# Patient Record
Sex: Female | Born: 1995 | Hispanic: Yes | Marital: Single | Smoking: Never smoker
Health system: Southern US, Community
[De-identification: ages and names within clinical notes are randomized; demographics above are authoritative.]

## PROBLEM LIST (undated history)

## (undated) ENCOUNTER — Inpatient Hospital Stay (HOSPITAL_COMMUNITY): Payer: Self-pay

## (undated) DIAGNOSIS — N200 Calculus of kidney: Secondary | ICD-10-CM

## (undated) DIAGNOSIS — F32A Depression, unspecified: Secondary | ICD-10-CM

## (undated) DIAGNOSIS — F329 Major depressive disorder, single episode, unspecified: Secondary | ICD-10-CM

## (undated) HISTORY — PX: NO PAST SURGERIES: SHX2092

## (undated) HISTORY — PX: OTHER SURGICAL HISTORY: SHX169

---
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
## 2015-12-15 ENCOUNTER — Encounter (HOSPITAL_COMMUNITY): Payer: Self-pay | Admitting: Emergency Medicine

## 2015-12-15 ENCOUNTER — Emergency Department (HOSPITAL_COMMUNITY)
Admission: EM | Admit: 2015-12-15 | Discharge: 2015-12-15 | Disposition: A | Discharge status: Home / Self Care | Payer: Medicaid Other | Attending: Emergency Medicine | Admitting: Emergency Medicine

## 2015-12-15 DIAGNOSIS — L731 Pseudofolliculitis barbae: Secondary | ICD-10-CM | POA: Insufficient documentation

## 2015-12-15 DIAGNOSIS — Z791 Long term (current) use of non-steroidal anti-inflammatories (NSAID): Secondary | ICD-10-CM | POA: Diagnosis not present

## 2015-12-15 DIAGNOSIS — Z792 Long term (current) use of antibiotics: Secondary | ICD-10-CM | POA: Insufficient documentation

## 2015-12-15 DIAGNOSIS — R21 Rash and other nonspecific skin eruption: Secondary | ICD-10-CM | POA: Diagnosis present

## 2015-12-15 NOTE — Discharge Instructions (Signed)
Ingrown Hair An ingrown hair is a hair that curls and re-enters the skin instead of growing straight out of the skin. It happens most often with curly hair. It is usually more severe in the neck area, but it can occur in any shaved area, including the beard area, groin, scalp, and legs. An ingrown hair may cause small pockets of infection. CAUSES  Shaving closely, tweezing, or waxing, especially curly hair. Using hair removal creams can sometimes lead to ingrown hairs, especially in the groin. SYMPTOMS   Small bumps on the skin. The bumps may be filled with pus.  Pain.  Itching. DIAGNOSIS  Your caregiver can usually tell what is wrong by doing a physical exam. TREATMENT  If there is a severe infection, your caregiver may prescribe antibiotic medicines. Laser hair removal may also be done to help prevent regrowth of the hair. HOME CARE INSTRUCTIONS   Do not shave irritated skin. You may start shaving again once the irritation has gone away.  If you are prone to ingrown hairs, consider not shaving as much as possible.  If antibiotics are prescribed, take them as directed. Finish them even if you start to feel better.  You may use a facial sponge in a gentle circular motion to help dislodge ingrown hairs on the face.  You may use a hair removal cream weekly, especially on the legs and underarms. Stop using the cream if it irritates your skin. Use caution when using hair removal creams in the groin area. SHAVING INSTRUCTIONS AFTER TREATMENT  Shower before shaving. Keep areas to be shaved packed in warm, moist wraps for several minutes before shaving. The warm, moist environment helps soften the hairs and makes ingrown hairs less likely to occur.  Use thick shaving gels.  Use a bump fighter razor that cuts hair slightly above the skin level or use an electric shaver with a longer shave setting.  Shave in the direction of hair growth. Avoid making multiple razor strokes.  Use  moisturizing lotions after shaving.   This information is not intended to replace advice given to you by your health care provider. Make sure you discuss any questions you have with your health care provider.   Document Released: 08/19/2000 Document Revised: 11/12/2011 Document Reviewed: 08/13/2011 Elsevier Interactive Patient Education 2016 [employer]  

## 2015-12-15 NOTE — ED Provider Notes (Signed)
CSN: [id number]     Arrival date & time 12/15/15  0025 History  By signing my name below, I, Emmanuella Mensah, attest that this documentation has been prepared under the direction and in the presence of Melene Plan, DO. Electronically Signed: Angelene Giovanni, ED Scribe. 12/15/2015. 1:21 AM.    Chief Complaint  Patient presents with  . Rash   Patient is a 20 y.o. female presenting with rash. The history is provided by the patient. No language interpreter was used.  Rash Location:  Ano-genital Ano-genital rash location:  Vagina Quality: blistering and itchiness   Quality: not painful   Severity:  Moderate Onset quality:  Sudden Duration:  1 week Timing:  Constant Chronicity:  New Context: sick contacts   Relieved by:  Anti-itch cream Worsened by:  Nothing tried Ineffective treatments:  None tried Associated symptoms: no abdominal pain, no fever, no headaches, no joint pain, no myalgias, no nausea, no shortness of breath, not vomiting and not wheezing    HPI Comments: Zoe-Rosenda Lawhorne is a 20 y.o. female who presents to the Emergency Department requesting evaluation for an itchy, blistering "spot" on her vaginal area that occurred one week ago. Pt denies any pain to the area. She explains that her boyfriend was diagnosed with chicken pox last week with a similar rash to his arms and legs. No alleviating factors noted. She states that she has tried Cortisone cream with no relief. No fever, chills, abdominal pain, or n/v.    History reviewed. No pertinent past medical history. History reviewed. No pertinent past surgical history. History reviewed. No pertinent family history. Social History  Substance Use Topics  . Smoking status: Never Smoker   . Smokeless tobacco: None  . Alcohol Use: No   OB History    No data available     Review of Systems  Constitutional: Negative for fever and chills.  HENT: Negative for congestion and rhinorrhea.   Eyes: Negative for redness and visual  disturbance.  Respiratory: Negative for shortness of breath and wheezing.   Cardiovascular: Negative for chest pain and palpitations.  Gastrointestinal: Negative for nausea, vomiting and abdominal pain.  Genitourinary: Negative for dysuria and urgency.  Musculoskeletal: Negative for myalgias and arthralgias.  Skin: Positive for rash. Negative for pallor and wound.  Neurological: Negative for dizziness and headaches.  All other systems reviewed and are negative.     Allergies  Mango flavor  Home Medications   Prior to Admission medications   Medication Sig Start Date End Date Taking? Authorizing Provider  amoxicillin (AMOXIL) 500 MG capsule Take 1 capsule (500 mg total) by mouth 2 (two) times daily. 12/27/14   Charm Rings, MD  naproxen (NAPROSYN) 500 MG tablet Take 1 tablet (500 mg total) by mouth 2 (two) times daily with a meal. 03/27/14   Everlene Farrier, PA-C  trimethoprim-polymyxin b (POLYTRIM) ophthalmic solution Place 1 drop into both eyes every 4 (four) hours. For 7 days 12/27/14   Charm Rings, MD   BP 103/80 mmHg  Pulse 67  Temp(Src) 98.2 F (36.8 C) (Oral)  Resp 18  SpO2 100%  LMP 11/21/2015 (Approximate) Physical Exam  Constitutional: She is oriented to person, place, and time. She appears well-developed and well-nourished. No distress.  HENT:  Head: Normocephalic and atraumatic.  Eyes: EOM are normal.  Neck: Normal range of motion.  Cardiovascular: Normal rate, regular rhythm and normal heart sounds.   Pulmonary/Chest: Effort normal and breath sounds normal.  Abdominal: Soft. She exhibits no distension. There  is no tenderness.  Musculoskeletal: Normal range of motion.  Neurological: She is alert and oriented to person, place, and time.  Skin: Skin is warm and dry. There is erythema.  Small erythematous papule in pubic mons, Non TTP, no fluctuance, similar lesion laterally that is mildly tender  Psychiatric: She has a normal mood and affect. Judgment normal.  Nursing  note and vitals reviewed.   ED Course  Procedures (including critical care time) DIAGNOSTIC STUDIES: Oxygen Saturation is 99% on RA, normal by my interpretation.    COORDINATION OF CARE: 1:10 AM- Pt advised of plan for treatment and pt agrees. Pt advised to use warm compresses.   MDM   Final diagnoses:  Ingrown hair    20 yo F With a chief complaint of a itchy bump to her pubis. She is concerned because her boyfriend was recently diagnosed with chickenpox. She is unsure if she is immunized though is in college. On my exam patient appears to have an ingrown hair. Discussed with patient about follow-up. She is advised to avoid immunosuppressed individuals as well as pregnant individuals until she was sure the rash did not spread. I personally performed the services described in this documentation, which was scribed in my presence. The recorded information has been reviewed and is accurate.   2:26 AM:  I have discussed the diagnosis/risks/treatment options with the patient and family and believe the pt to be eligible for discharge home to follow-up with PCP. We also discussed returning to the ED immediately if new or worsening sx occur. We discussed the sx which are most concerning (e.g., sudden worsening pain, fever, inability to tolerate by mouth) that necessitate immediate return. Medications administered to the patient during their visit and any new prescriptions provided to the patient are listed below.  Medications given during this visit Medications - No data to display  Discharge Medication List as of 12/15/2015  1:19 AM      The patient appears reasonably screen and/or stabilized for discharge and I doubt any other medical condition or other [facility] requiring further screening, evaluation, or treatment in the ED at this time prior to discharge.      Melene Planan Tanvi Gatling, DO 12/15/15 [id number]

## 2015-12-15 NOTE — ED Notes (Signed)
Pt states that her boyfriend had 'spots' on his arm and leg and was reportedly dx with chicken pox. Pt now has a 'spot' on her vaginal area. Alert and oriented.

## 2015-12-24 ENCOUNTER — Encounter (HOSPITAL_COMMUNITY): Payer: Self-pay | Admitting: Emergency Medicine

## 2015-12-24 ENCOUNTER — Emergency Department (HOSPITAL_COMMUNITY)
Admission: EM | Admit: 2015-12-24 | Discharge: 2015-12-24 | Disposition: A | Discharge status: Home / Self Care | Payer: Medicaid Other | Attending: Emergency Medicine | Admitting: Emergency Medicine

## 2015-12-24 DIAGNOSIS — L739 Follicular disorder, unspecified: Secondary | ICD-10-CM

## 2015-12-24 DIAGNOSIS — Z202 Contact with and (suspected) exposure to infections with a predominantly sexual mode of transmission: Secondary | ICD-10-CM | POA: Insufficient documentation

## 2015-12-24 DIAGNOSIS — N764 Abscess of vulva: Secondary | ICD-10-CM | POA: Diagnosis present

## 2015-12-24 LAB — RAPID HIV SCREEN (HIV 1/2 AB+AG)
HIV 1/2 ANTIBODIES: NONREACTIVE
HIV-1 P24 ANTIGEN - HIV24: NONREACTIVE

## 2015-12-24 LAB — URINALYSIS, ROUTINE W REFLEX MICROSCOPIC
Bilirubin Urine: NEGATIVE
GLUCOSE, UA: NEGATIVE mg/dL
Hgb urine dipstick: NEGATIVE
KETONES UR: NEGATIVE mg/dL
LEUKOCYTES UA: NEGATIVE
NITRITE: NEGATIVE
PH: 8 (ref 5.0–8.0)
PROTEIN: NEGATIVE mg/dL
Specific Gravity, Urine: 1.022 (ref 1.005–1.030)

## 2015-12-24 LAB — WET PREP, GENITAL
Clue Cells Wet Prep HPF POC: NONE SEEN
SPERM: NONE SEEN
Trich, Wet Prep: NONE SEEN
YEAST WET PREP: NONE SEEN

## 2015-12-24 LAB — RPR: RPR Ser Ql: NONREACTIVE

## 2015-12-24 LAB — URINE MICROSCOPIC-ADD ON

## 2015-12-24 LAB — POC URINE PREG, ED: Preg Test, Ur: NEGATIVE

## 2015-12-24 MED ORDER — CEFTRIAXONE SODIUM 250 MG IJ SOLR
250.0000 mg | Freq: Once | INTRAMUSCULAR | Status: AC
  Administered 2015-12-24: 250 mg via INTRAMUSCULAR
  Filled 2015-12-24: qty 250

## 2015-12-24 MED ORDER — STERILE WATER FOR INJECTION IJ SOLN
INTRAMUSCULAR | Status: AC
  Administered 2015-12-24: 10 mL
  Filled 2015-12-24: qty 10

## 2015-12-24 MED ORDER — MUPIROCIN CALCIUM 2 % EX CREA: 1.0000 "application " | TOPICAL_CREAM | Freq: Two times a day (BID) | CUTANEOUS | 0 refills | Status: DC

## 2015-12-24 MED ORDER — AZITHROMYCIN 250 MG PO TABS
1000.0000 mg | ORAL_TABLET | Freq: Once | ORAL | Status: AC
  Administered 2015-12-24: 1000 mg via ORAL
  Filled 2015-12-24: qty 4

## 2015-12-24 NOTE — Discharge Instructions (Signed)
You were seen and evaluated today for your rash.  This appears to be some sort of infection.  It could be a Herpes infection and cultures were sent to evaluate for this.  If the culture is positive you will be called.  It also appears that it could just be a folliculitis or infection of the skin around hair follicles.  Use the antibiotic cream prescribed.

## 2015-12-24 NOTE — ED Triage Notes (Signed)
Pt. reports skin abscess at pubic area with drainage , requesting STD screening , denies dysuria or vaginal discharge .

## 2015-12-24 NOTE — ED Notes (Signed)
Pt is in stable condition upon d/c and ambulates from ED. 

## 2015-12-24 NOTE — ED Provider Notes (Signed)
[facility]-EMERGENCY DEPT Provider Note   CSN: [id number] Arrival date & time: 12/24/15  0310  First Provider Contact:  First MD Initiated Contact with Patient 12/24/15 [phone]        History   Chief Complaint Chief Complaint  Patient presents with  . Abscess    HPI Adrienne Craig is a 20 y.o. female.  20 y.o. Female with no significant past medical history presents for concern for possible abscess.  The patient states that for over 1 week she has had a skin lesion in the genital area.  She states that she was seen for this same issue 1 week ago and was told that it was an ingrown hair.  The patient states that her boyfriend had a similar rash and was told he had a virus of some sort and was given a shot in the butt.  She states that since that time the two of them have had sexual intercourse and she is concerned and would like to be tested and treated for STDs.       History reviewed. No pertinent past medical history.  There are no active problems to display for this patient.   History reviewed. No pertinent surgical history.  OB History    No data available       Home Medications    Prior to Admission medications   Medication Sig Start Date End Date Taking? Authorizing Provider  amoxicillin (AMOXIL) 500 MG capsule Take 1 capsule (500 mg total) by mouth 2 (two) times daily. Patient not taking: Reported on 12/24/2015 12/27/14   Charm Rings, MD  mupirocin cream (BACTROBAN) 2 % Apply 1 application topically 2 (two) times daily. 12/24/15   Leta Baptist, MD  naproxen (NAPROSYN) 500 MG tablet Take 1 tablet (500 mg total) by mouth 2 (two) times daily with a meal. Patient not taking: Reported on 12/24/2015 03/27/14   Everlene Farrier, PA-C  trimethoprim-polymyxin b (POLYTRIM) ophthalmic solution Place 1 drop into both eyes every 4 (four) hours. For 7 days Patient not taking: Reported on 12/24/2015 12/27/14   Charm Rings, MD    Family History No family history on file.  Social  History Social History  Substance Use Topics  . Smoking status: Never Smoker  . Smokeless tobacco: Not on file  . Alcohol use No     Allergies   Mango flavor [flavoring agent]   Review of Systems Review of Systems  Constitutional: Negative for appetite change, diaphoresis, fatigue and fever.  HENT: Negative for congestion, postnasal drip and rhinorrhea.   Eyes: Negative for visual disturbance.  Respiratory: Negative for cough, chest tightness, shortness of breath and wheezing.   Cardiovascular: Negative for chest pain and palpitations.  Gastrointestinal: Negative for abdominal pain, diarrhea, nausea and vomiting.  Genitourinary: Negative for dysuria, frequency and urgency.  Musculoskeletal: Negative for back pain and myalgias.  Skin: Positive for rash.  Neurological: Negative for dizziness, weakness, light-headedness and headaches.  Hematological: Does not bruise/bleed easily.     Physical Exam Updated Vital Signs BP 100/55   Pulse (!) 52   LMP 12/12/2015 (Approximate)   SpO2 99%   Physical Exam  Constitutional: She is oriented to person, place, and time. She appears well-developed and well-nourished. No distress.  HENT:  Head: Normocephalic and atraumatic.  Right Ear: External ear normal.  Left Ear: External ear normal.  Nose: Nose normal.  Mouth/Throat: Oropharynx is clear and moist. No oropharyngeal exudate.  Eyes: EOM are normal. Pupils are equal, round, and reactive  to light.  Neck: Normal range of motion. Neck supple.  Cardiovascular: Normal rate, regular rhythm, normal heart sounds and intact distal pulses.   No murmur heard. Pulmonary/Chest: Effort normal. No respiratory distress. She has no wheezes. She has no rales.  Abdominal: Soft. She exhibits no distension. There is no tenderness.  Genitourinary: Uterus normal. No labial fusion. There is no rash on the right labia. There is no rash on the left labia. Cervix exhibits discharge and friability. Cervix  exhibits no motion tenderness. Right adnexum displays no mass, no tenderness and no fullness. Left adnexum displays no mass, no tenderness and no fullness. No erythema or bleeding in the vagina. Vaginal discharge found.  Genitourinary Comments: Over the pubis area there are multiple erythematous, dried lesions as well as one area with two vesicular type lesions.  No abscess.  No labial involvement  Musculoskeletal: Normal range of motion. She exhibits no edema or tenderness.  Neurological: She is alert and oriented to person, place, and time.  Skin: Skin is warm and dry. No rash noted. She is not diaphoretic.  Vitals reviewed.    ED Treatments / Results  Labs (all labs ordered are listed, but only abnormal results are displayed) Labs Reviewed  WET PREP, GENITAL - Abnormal; Notable for the following:       Result Value   WBC, Wet Prep HPF POC MANY (*)    All other components within normal limits  URINALYSIS, ROUTINE W REFLEX MICROSCOPIC (NOT AT [facility]) - Abnormal; Notable for the following:    APPearance TURBID (*)    All other components within normal limits  URINE MICROSCOPIC-ADD ON - Abnormal; Notable for the following:    Squamous Epithelial / LPF 0-5 (*)    Bacteria, UA FEW (*)    All other components within normal limits  HSV CULTURE AND TYPING  RAPID HIV SCREEN (HIV 1/2 AB+AG)  RPR  POC URINE PREG, ED  GC/CHLAMYDIA PROBE AMP ([place]) NOT AT [facility]    EKG  EKG Interpretation None       Radiology No results found.  Procedures Procedures (including critical care time)  Medications Ordered in ED Medications  cefTRIAXone (ROCEPHIN) injection 250 mg (250 mg Intramuscular Given 12/24/15 0957)  azithromycin (ZITHROMAX) tablet 1,000 mg (1,000 mg Oral Given 12/24/15 0957)  sterile water (preservative free) injection (10 mLs  Given 12/24/15 0957)     Initial Impression / Assessment and Plan / ED Course  I have reviewed the triage vital signs and the nursing  notes.  Pertinent labs & imaging results that were available during my care of the patient were reviewed by me and considered in my medical decision making (see chart for details).  Clinical Course    Patient seen and evaluated in stable condition.  Rash consistent with folliculitis vs HSV.  Discussed with patient who did not want treatment for HSV at this time and wanted to wait for culture results.  STI cultures sent.  Patient treated with Rocephin and Azithromycin.  Patient discharged home in stable condition with prescription for Mupirocin.    Final Clinical Impressions(s) / ED Diagnoses   Final diagnoses:  Folliculitis  Possible exposure to STD    New Prescriptions Discharge Medication List as of 12/24/2015 10:01 AM    START taking these medications   Details  mupirocin cream (BACTROBAN) 2 % Apply 1 application topically 2 (two) times daily., Starting Sun 12/24/2015, Print         Leta Baptist, MD 12/24/15 1556

## 2015-12-25 LAB — GC/CHLAMYDIA PROBE AMP (~~LOC~~) NOT AT ARMC
Chlamydia: NEGATIVE
Neisseria Gonorrhea: NEGATIVE

## 2015-12-26 LAB — HSV CULTURE AND TYPING

## 2016-05-13 ENCOUNTER — Emergency Department (HOSPITAL_COMMUNITY)
Admission: EM | Admit: 2016-05-13 | Discharge: 2016-05-13 | Disposition: A | Discharge status: Home / Self Care | Payer: Medicaid Other | Attending: Emergency Medicine | Admitting: Emergency Medicine

## 2016-05-13 ENCOUNTER — Encounter (HOSPITAL_COMMUNITY): Payer: Self-pay | Admitting: Emergency Medicine

## 2016-05-13 ENCOUNTER — Ambulatory Visit (INDEPENDENT_AMBULATORY_CARE_PROVIDER_SITE_OTHER): Payer: Self-pay | Admitting: Urgent Care

## 2016-05-13 ENCOUNTER — Emergency Department (HOSPITAL_COMMUNITY): Payer: Medicaid Other

## 2016-05-13 VITALS — BP 134/76 | HR 86 | Temp 97.7°F | Resp 20

## 2016-05-13 DIAGNOSIS — N132 Hydronephrosis with renal and ureteral calculous obstruction: Secondary | ICD-10-CM | POA: Diagnosis not present

## 2016-05-13 DIAGNOSIS — R112 Nausea with vomiting, unspecified: Secondary | ICD-10-CM

## 2016-05-13 DIAGNOSIS — R319 Hematuria, unspecified: Secondary | ICD-10-CM

## 2016-05-13 DIAGNOSIS — R1031 Right lower quadrant pain: Secondary | ICD-10-CM

## 2016-05-13 DIAGNOSIS — R100 Acute abdomen: Secondary | ICD-10-CM

## 2016-05-13 DIAGNOSIS — N23 Unspecified renal colic: Secondary | ICD-10-CM

## 2016-05-13 DIAGNOSIS — Z79899 Other long term (current) drug therapy: Secondary | ICD-10-CM | POA: Insufficient documentation

## 2016-05-13 DIAGNOSIS — R109 Unspecified abdominal pain: Secondary | ICD-10-CM | POA: Diagnosis present

## 2016-05-13 LAB — POC MICROSCOPIC URINALYSIS (UMFC): Mucus: ABSENT

## 2016-05-13 LAB — COMPREHENSIVE METABOLIC PANEL
ALK PHOS: 74 U/L (ref 38–126)
ALT: 61 U/L — ABNORMAL HIGH (ref 14–54)
AST: 50 U/L — AB (ref 15–41)
Albumin: 4.6 g/dL (ref 3.5–5.0)
Anion gap: 12 (ref 5–15)
BILIRUBIN TOTAL: 1.5 mg/dL — AB (ref 0.3–1.2)
BUN: 14 mg/dL (ref 6–20)
CALCIUM: 9.2 mg/dL (ref 8.9–10.3)
CO2: 20 mmol/L — ABNORMAL LOW (ref 22–32)
CREATININE: 0.86 mg/dL (ref 0.44–1.00)
Chloride: 106 mmol/L (ref 101–111)
GFR calc Af Amer: >60 mL/min (ref >60)
Glucose, Bld: 106 mg/dL — ABNORMAL HIGH (ref 65–99)
Potassium: 3.6 mmol/L (ref 3.5–5.1)
Sodium: 138 mmol/L (ref 135–145)
TOTAL PROTEIN: 8.3 g/dL — AB (ref 6.5–8.1)

## 2016-05-13 LAB — POCT URINALYSIS DIP (MANUAL ENTRY)
BILIRUBIN UA: NEGATIVE
BILIRUBIN UA: NEGATIVE
GLUCOSE UA: NEGATIVE
Nitrite, UA: NEGATIVE
Urobilinogen, UA: 0.2
pH, UA: 6.5

## 2016-05-13 LAB — POCT CBC
Granulocyte percent: 76.3 %G (ref 37–80)
HCT, POC: 39.7 % (ref 37.7–47.9)
HEMOGLOBIN: 14.4 g/dL (ref 12.2–16.2)
Lymph, poc: 2.2 (ref 0.6–3.4)
MCH: 32.2 pg — AB (ref 27–31.2)
MCHC: 36.3 g/dL — AB (ref 31.8–35.4)
MCV: 88.7 fL (ref 80–97)
MID (CBC): 0.4 (ref 0–0.9)
MPV: 6.2 fL (ref 0–99.8)
PLATELET COUNT, POC: 265 10*3/uL (ref 142–424)
POC Granulocyte: 8.5 — AB (ref 2–6.9)
POC LYMPH PERCENT: 19.9 %L (ref 10–50)
POC MID %: 3.8 %M (ref 0–12)
RBC: 4.47 M/uL (ref 4.04–5.48)
RDW, POC: 12.2 %
WBC: 11.2 10*3/uL — AB (ref 4.6–10.2)

## 2016-05-13 LAB — URINALYSIS, ROUTINE W REFLEX MICROSCOPIC
Bilirubin Urine: NEGATIVE
GLUCOSE, UA: NEGATIVE mg/dL
Ketones, ur: 20 mg/dL — AB
Nitrite: NEGATIVE
PH: 7 (ref 5.0–8.0)
Protein, ur: 30 mg/dL — AB
Specific Gravity, Urine: 1.02 (ref 1.005–1.030)

## 2016-05-13 LAB — POCT URINE PREGNANCY: PREG TEST UR: NEGATIVE

## 2016-05-13 MED ORDER — IOPAMIDOL (ISOVUE-300) INJECTION 61%
100.0000 mL | Freq: Once | INTRAVENOUS | Status: AC | PRN
Start: 2016-05-13 — End: 2016-05-13
  Administered 2016-05-13: 100 mL via INTRAVENOUS

## 2016-05-13 MED ORDER — HYDROCODONE-ACETAMINOPHEN 5-325 MG PO TABS: 1.0000 | ORAL_TABLET | Freq: Four times a day (QID) | ORAL | 0 refills | Status: DC | PRN

## 2016-05-13 MED ORDER — SODIUM CHLORIDE 0.9 % IV BOLUS (SEPSIS)
1000.0000 mL | Freq: Once | INTRAVENOUS | Status: AC
  Administered 2016-05-13: 1000 mL via INTRAVENOUS

## 2016-05-13 MED ORDER — MORPHINE SULFATE (PF) 4 MG/ML IV SOLN
4.0000 mg | Freq: Once | INTRAVENOUS | Status: AC
Start: 2016-05-13 — End: 2016-05-13
  Administered 2016-05-13: 4 mg via INTRAVENOUS
  Filled 2016-05-13: qty 1

## 2016-05-13 MED ORDER — IOPAMIDOL (ISOVUE-300) INJECTION 61%
INTRAVENOUS | Status: AC
  Filled 2016-05-13: qty 100

## 2016-05-13 MED ORDER — MORPHINE SULFATE (PF) 4 MG/ML IV SOLN
4.0000 mg | Freq: Once | INTRAVENOUS | Status: AC
  Administered 2016-05-13: 4 mg via INTRAVENOUS
  Filled 2016-05-13: qty 1

## 2016-05-13 MED ORDER — KETOROLAC TROMETHAMINE 60 MG/2ML IM SOLN
60.0000 mg | Freq: Once | INTRAMUSCULAR | Status: AC
  Administered 2016-05-13: 60 mg via INTRAMUSCULAR

## 2016-05-13 MED ORDER — ONDANSETRON HCL 4 MG/2ML IJ SOLN
4.0000 mg | Freq: Once | INTRAMUSCULAR | Status: AC
  Administered 2016-05-13: 4 mg via INTRAVENOUS
  Filled 2016-05-13: qty 2

## 2016-05-13 MED ORDER — KETOROLAC TROMETHAMINE 15 MG/ML IJ SOLN
15.0000 mg | Freq: Once | INTRAMUSCULAR | Status: AC
  Administered 2016-05-13: 15 mg via INTRAVENOUS
  Filled 2016-05-13: qty 1

## 2016-05-13 MED ORDER — TAMSULOSIN HCL 0.4 MG PO CAPS: 0.4000 mg | ORAL_CAPSULE | Freq: Every day | ORAL | 0 refills | Status: DC

## 2016-05-13 MED ORDER — IOPAMIDOL (ISOVUE-300) INJECTION 61%
INTRAVENOUS | Status: AC
  Filled 2016-05-13: qty 30

## 2016-05-13 NOTE — ED Provider Notes (Signed)
[facility]-EMERGENCY DEPT Provider Note   CSN: [id number] Arrival date & time: 05/13/16  1533     History   Chief Complaint Chief Complaint  Patient presents with  . sent from Urgent care elevated WBC  . Abdominal Pain  . Emesis  . Diarrhea    HPI Adrienne Craig is a 20 y.o. female.  The history is provided by the patient. No language interpreter was used.  Abdominal Pain   Associated symptoms include diarrhea and vomiting.  Emesis   Associated symptoms include abdominal pain and diarrhea.  Diarrhea   Associated symptoms include abdominal pain and vomiting.    Adrienne Craig is a 20 y.o. female who presents to the Emergency Department complaining of abdominal pain.  She presents for evaluation of abdominal pain after referral from urgent care. At 11 AM this morning she developed sudden onset right quadrant abdominal pain with associated nausea, vomiting, diarrhea. Pain is sharp and constant in nature. She denies any associated fever, dysuria, vaginal discharge. No prior similar symptoms. She had a negative urine pregnancy test at urgent care as well as a CBC that demonstrated leukocytosis with a white blood count of 11k.  History reviewed. No pertinent past medical history.  There are no active problems to display for this patient.   History reviewed. No pertinent surgical history.  OB History    No data available       Home Medications    Prior to Admission medications   Medication Sig Start Date End Date Taking? Authorizing Provider  ibuprofen (ADVIL,MOTRIN) 200 MG tablet Take 800 mg by mouth once.   Yes Historical Provider, MD  HYDROcodone-acetaminophen (NORCO/VICODIN) 5-325 MG tablet Take 1 tablet by mouth every 6 (six) hours as needed. 05/13/16   Tilden FossaElizabeth Morley Gaumer, MD  tamsulosin (FLOMAX) 0.4 MG CAPS capsule Take 1 capsule (0.4 mg total) by mouth daily. 05/13/16   Tilden FossaElizabeth Ralynn San, MD    Family History No family history on file.  Social History Social History   Substance Use Topics  . Smoking status: Never Smoker  . Smokeless tobacco: Never Used  . Alcohol use No     Allergies   Mango flavor [flavoring agent]   Review of Systems Review of Systems  Gastrointestinal: Positive for abdominal pain, diarrhea and vomiting.  All other systems reviewed and are negative.    Physical Exam Updated Vital Signs BP 112/71 (BP Location: Left Arm)   Pulse 64   Temp 98.4 F (36.9 C) (Oral)   Resp 16   Ht 5\' 2"  (1.575 m)   Wt 170 lb (77.1 kg)   LMP 05/05/2016   SpO2 100%   BMI 31.09 kg/m   Physical Exam  Constitutional: She is oriented to person, place, and time. She appears well-developed and well-nourished.  Uncomfortable appearing  HENT:  Head: Normocephalic and atraumatic.  Cardiovascular: Normal rate and regular rhythm.   No murmur heard. Pulmonary/Chest: Effort normal and breath sounds normal. No respiratory distress.  Abdominal: Soft. There is no rebound and no guarding.  Mild to moderate RLQ tenderness  Musculoskeletal: She exhibits no edema or tenderness.  Neurological: She is alert and oriented to person, place, and time.  Skin: Skin is warm and dry.  Psychiatric: She has a normal mood and affect. Her behavior is normal.  Nursing note and vitals reviewed.    ED Treatments / Results  Labs (all labs ordered are listed, but only abnormal results are displayed) Labs Reviewed  COMPREHENSIVE METABOLIC PANEL - Abnormal; Notable for the following:  Result Value   CO2 20 (*)    Glucose, Bld 106 (*)    Total Protein 8.3 (*)    AST 50 (*)    ALT 61 (*)    Total Bilirubin 1.5 (*)    All other components within normal limits  URINALYSIS, ROUTINE W REFLEX MICROSCOPIC - Abnormal; Notable for the following:    Hgb urine dipstick MODERATE (*)    Ketones, ur 20 (*)    Protein, ur 30 (*)    Leukocytes, UA MODERATE (*)    Bacteria, UA RARE (*)    Squamous Epithelial / LPF 0-5 (*)    All other components within normal limits    URINE CULTURE    EKG  EKG Interpretation None       Radiology Ct Abdomen Pelvis W Contrast  Result Date: 05/13/2016 CLINICAL DATA:  Leukocytosis and hematuria. Right lower quadrant pain. EXAM: CT ABDOMEN AND PELVIS WITH CONTRAST TECHNIQUE: Multidetector CT imaging of the abdomen and pelvis was performed using the standard protocol following bolus administration of intravenous contrast. CONTRAST:  ISOVUE-300 IOPAMIDOL (ISOVUE-300) INJECTION 61% COMPARISON:  None. FINDINGS: Lower chest: No acute abnormality. Hepatobiliary: No focal liver abnormality is seen. No gallstones, gallbladder wall thickening, or biliary dilatation. Pancreas: Unremarkable. No pancreatic ductal dilatation or surrounding inflammatory changes. Spleen: Normal in size without focal abnormality. Adrenals/Urinary Tract: Both adrenals are normal. There is hydronephrosis and ureteral dilatation on the right, due to an obstructing 3 mm calculus at the ureterovesical junction. Left kidney and ureter are normal. Urinary bladder is unremarkable. No perinephric abscess or collection. Stomach/Bowel: Stomach is within normal limits. Appendix is normal. No evidence of bowel wall thickening, distention, or inflammatory changes. Vascular/Lymphatic: No significant vascular findings are present. No enlarged abdominal or pelvic lymph nodes. Reproductive: Uterus and bilateral adnexa are unremarkable. Other: No ascites.  Small fat containing umbilical hernia. Musculoskeletal: No significant skeletal lesion. IMPRESSION: 1. Obstructing 3 mm right UVJ calculus with moderate hydronephrosis. No other acute findings. 2. Normal appendix.  Unremarkable uterus and ovaries. Electronically Signed   By: Ellery Plunk M.D.   On: 05/13/2016 21:30    Procedures Procedures (including critical care time)  Medications Ordered in ED Medications  sodium chloride 0.9 % bolus 1,000 mL (0 mLs Intravenous Stopped 05/13/16 2254)  morphine 4 MG/ML injection  4 mg (4 mg Intravenous Given 05/13/16 2035)  ondansetron (ZOFRAN) injection 4 mg (4 mg Intravenous Given 05/13/16 2035)  iopamidol (ISOVUE-300) 61 % injection 100 mL (100 mLs Intravenous Contrast Given 05/13/16 2104)  morphine 4 MG/ML injection 4 mg (4 mg Intravenous Given 05/13/16 2111)  ketorolac (TORADOL) 15 MG/ML injection 15 mg (15 mg Intravenous Given 05/13/16 2214)     Initial Impression / Assessment and Plan / ED Course  I have reviewed the triage vital signs and the nursing notes.  Pertinent labs & imaging results that were available during my care of the patient were reviewed by me and considered in my medical decision making (see chart for details).  Clinical Course     Patient here for evaluation of sudden onset right lower quadrant abdominal pain with vomiting. On initial evaluation patient uncomfortable appearing. After treatment with IV fluids, morphine, Toradol her symptoms have significantly improved and on repeat assessment her pain was resolved. CT scan demonstrates a right ureteral stone. UA and symptoms are not consistent with infection but will send cultures. Counseled patient on findings of kidney stone. Discussed outpatient follow-up, home care and close return precautions.  Final Clinical  Impressions(s) / ED Diagnoses   Final diagnoses:  Renal colic on right side    New Prescriptions Discharge Medication List as of 05/13/2016 10:40 PM    START taking these medications   Details  HYDROcodone-acetaminophen (NORCO/VICODIN) 5-325 MG tablet Take 1 tablet by mouth every 6 (six) hours as needed., Starting Mon 05/13/2016, Print    tamsulosin (FLOMAX) 0.4 MG CAPS capsule Take 1 capsule (0.4 mg total) by mouth daily., Starting Mon 05/13/2016, Print         Tilden FossaElizabeth Alithia Zavaleta, MD 05/14/16 [phone]

## 2016-05-13 NOTE — Progress Notes (Signed)
MRN: [id number] DOB: [birth year]  Subjective:   Adrienne Craig is a 20 y.o. female presenting for chief complaint of Abdominal Pain (RLQ) and Emesis  Reports sudden onset of right lower quadrant pain today, with vomiting x1 that was very watery, also had diarrhea x1. Has since had difficulty holding foods and liquids down. Her pain is sharp, severe, non-radiating. Denies fever, bloating, hematemesis, dysuria, hematuria, blood in stool. Has tried ibuprofen but did not hold this down. LMP was 05/05/2016, cycle was irregular due to her abnormal cramps.   Adrienne currently has no medications in their medication list. Also is allergic to mango flavor [flavoring agent].  Adrienne  has no past medical history on file. Also  has no past surgical history on file.  Objective:   Vitals: BP 134/76   Pulse 86   Temp 97.7 F (36.5 C) (Oral)   Resp 20   LMP 05/05/2016   SpO2 99%   Physical Exam  Constitutional: She is oriented to person, place, and time. She appears well-developed and well-nourished.  HENT:  Mucous membranes are dry.  Eyes: Right eye exhibits no discharge. Left eye exhibits no discharge. No scleral icterus.  Cardiovascular: Normal rate, regular rhythm and intact distal pulses.  Exam reveals no gallop and no friction rub.   No murmur heard. Pulmonary/Chest: No respiratory distress. She has no wheezes. She has no rales.  Abdominal: Soft. Bowel sounds are normal. She exhibits no distension and no mass. There is tenderness (RLQ, slightly positive Rovsing sign). There is no guarding.  Negative Psoas sign.  Neurological: She is alert and oriented to person, place, and time.  Skin: Skin is warm and dry.   No improvement in pain s/p 60mg  IM Toradol.   Results for orders placed or performed in visit on 05/13/16 (from the past 24 hour(s))  POCT urine pregnancy     Status: None   Collection Time: 05/13/16  2:14 PM  Result Value Ref Range   Preg Test, Ur Negative Negative   POCT CBC     Status: Abnormal   Collection Time: 05/13/16  2:14 PM  Result Value Ref Range   WBC 11.2 (A) 4.6 - 10.2 K/uL   Lymph, poc 2.2 0.6 - 3.4   POC LYMPH PERCENT 19.9 10 - 50 %L   MID (cbc) 0.4 0 - 0.9   POC MID % 3.8 0 - 12 %M   POC Granulocyte 8.5 (A) 2 - 6.9   Granulocyte percent 76.3 37 - 80 %G   RBC 4.47 4.04 - 5.48 M/uL   Hemoglobin 14.4 12.2 - 16.2 g/dL   HCT, POC 40.939.7 81.137.7 - 47.9 %   MCV 88.7 80 - 97 fL   MCH, POC 32.2 (A) 27 - 31.2 pg   MCHC 36.3 (A) 31.8 - 35.4 g/dL   RDW, POC 91.412.2 %   Platelet Count, POC 265 142 - 424 K/uL   MPV 6.2 0 - 99.8 fL  POCT Microscopic Urinalysis (UMFC)     Status: Abnormal   Collection Time: 05/13/16  2:45 PM  Result Value Ref Range   WBC,UR,HPF,POC Moderate (A) None WBC/hpf   RBC,UR,HPF,POC Moderate (A) None RBC/hpf   Bacteria None None, Too numerous to count   Mucus Absent Absent   Epithelial Cells, UR Per Microscopy Few (A) None, Too numerous to count cells/hpf  POCT urinalysis dipstick     Status: Abnormal   Collection Time: 05/13/16  2:46 PM  Result Value Ref Range   Color, UA  yellow yellow   Clarity, UA clear clear   Glucose, UA negative negative   Bilirubin, UA negative negative   Ketones, POC UA negative negative   Spec Grav, UA >=1.030    Blood, UA large (A) negative   pH, UA 6.5    Protein Ur, POC trace (A) negative   Urobilinogen, UA 0.2    Nitrite, UA Negative Negative   Leukocytes, UA Trace (A) Negative   Assessment and Plan :   1. Right lower quadrant abdominal pain 2. Acute abdomen 3. Hematuria, unspecified type 4. Nausea and vomiting, intractability of vomiting not specified, unspecified vomiting type - [facility] for evaluation of acute abdomen, r/o appendicitis, ovarian torsion, obstructing kidney stone. I discussed differential with patient and her mother, they opted to transport patient via EMS.   Wallis BambergMario Ashelynn Marks, PA-C [facility] [phone] 05/13/2016 1:54 PM

## 2016-05-13 NOTE — ED Triage Notes (Addendum)
Per EMS patient comes from Urgent care for r/o appendicitis or ovarian torsion. Patient has WBC 11.2 and leukocytes and blood in urine.  Patient has 22g in right Ac and [facility]  Patient adds that she has v/d. Diarrhea x 3

## 2016-05-13 NOTE — ED Notes (Signed)
Pt has increasing pain and continuing to vomit. RN Stanford BreedMacon and RN Lequita HaltMorgan have been made aware.

## 2016-05-13 NOTE — Patient Instructions (Addendum)
The most concerning diagnoses as a source of your belly pain are appendicitis, ovarian torsion, obstructing kidney stone. You will need an emergent evaluation including imaging such as a CT scan or ultrasound. However, a physician or physician assistant will evaluate you in the emergency department and make good clinical decisions for your there.     Abdominal Pain, Adult Abdominal pain can be caused by many things. Often, abdominal pain is not serious and it gets better with no treatment or by being treated at home. However, sometimes abdominal pain is serious. Your health care provider will do a medical history and a physical exam to try to determine the cause of your abdominal pain. Follow these instructions at home:  Take over-the-counter and prescription medicines only as told by your health care provider. Do not take a laxative unless told by your health care provider.  Drink enough fluid to keep your urine clear or pale yellow.  Watch your condition for any changes.  Keep all follow-up visits as told by your health care provider. This is important. Contact a health care provider if:  Your abdominal pain changes or gets worse.  You are not hungry or you lose weight without trying.  You are constipated or have diarrhea for more than 2-3 days.  You have pain when you urinate or have a bowel movement.  Your abdominal pain wakes you up at night.  Your pain gets worse with meals, after eating, or with certain foods.  You are throwing up and cannot keep anything down.  You have a fever. Get help right away if:  Your pain does not go away as soon as your health care provider told you to expect.  You cannot stop throwing up.  Your pain is only in areas of the abdomen, such as the right side or the left lower portion of the abdomen.  You have bloody or black stools, or stools that look like tar.  You have severe pain, cramping, or bloating in your abdomen.  You have signs of  dehydration, such as:  Dark urine, very little urine, or no urine.  Cracked lips.  Dry mouth.  Sunken eyes.  Sleepiness.  Weakness. This information is not intended to replace advice given to you by your health care provider. Make sure you discuss any questions you have with your health care provider. Document Released: 02/20/2005 Document Revised: 12/01/2015 Document Reviewed: 10/25/2015 Elsevier Interactive Patient Education  2017 [employer]    IF you received an x-ray today, you will receive an invoice from [facility]. Please contact [facility] at [phone] with questions or concerns regarding your invoice.   IF you received labwork today, you will receive an invoice from [facility]. Please contact [facility] at [phone] with questions or concerns regarding your invoice.   Our billing staff will not be able to assist you with questions regarding bills from these companies.  You will be contacted with the lab results as soon as they are available. The fastest way to get your results is to activate your My Chart account. Instructions are located on the last page of this paperwork. If you have not heard from us regarding the results in 2 weeks, please contact this office.

## 2016-05-13 NOTE — Discharge Instructions (Signed)
You have a 3 mm stone in your right ureter.  Get rechecked immediately if you develop fever, uncontrolled pain, or new concerning symptoms.

## 2016-05-15 LAB — URINE CULTURE

## 2017-02-01 ENCOUNTER — Emergency Department (HOSPITAL_COMMUNITY)
Admission: EM | Admit: 2017-02-01 | Discharge: 2017-02-01 | Disposition: A | Discharge status: Home / Self Care | Payer: Medicaid Other | Attending: Emergency Medicine | Admitting: Emergency Medicine

## 2017-02-01 ENCOUNTER — Encounter (HOSPITAL_COMMUNITY): Payer: Self-pay

## 2017-02-01 ENCOUNTER — Emergency Department (HOSPITAL_COMMUNITY): Payer: Medicaid Other

## 2017-02-01 DIAGNOSIS — Z3A01 Less than 8 weeks gestation of pregnancy: Secondary | ICD-10-CM | POA: Diagnosis not present

## 2017-02-01 DIAGNOSIS — R1030 Lower abdominal pain, unspecified: Secondary | ICD-10-CM | POA: Diagnosis not present

## 2017-02-01 DIAGNOSIS — O26891 Other specified pregnancy related conditions, first trimester: Secondary | ICD-10-CM | POA: Insufficient documentation

## 2017-02-01 DIAGNOSIS — Z3491 Encounter for supervision of normal pregnancy, unspecified, first trimester: Secondary | ICD-10-CM

## 2017-02-01 LAB — URINALYSIS, ROUTINE W REFLEX MICROSCOPIC
BACTERIA UA: NONE SEEN
BILIRUBIN URINE: NEGATIVE
Glucose, UA: NEGATIVE mg/dL
HGB URINE DIPSTICK: NEGATIVE
Ketones, ur: 5 mg/dL — AB
NITRITE: NEGATIVE
Protein, ur: NEGATIVE mg/dL
SPECIFIC GRAVITY, URINE: 1.028 (ref 1.005–1.030)
pH: 6 (ref 5.0–8.0)

## 2017-02-01 LAB — HCG, QUANTITATIVE, PREGNANCY: HCG, BETA CHAIN, QUANT, S: 2631 m[IU]/mL — AB (ref <5)

## 2017-02-01 LAB — PREGNANCY, URINE: PREG TEST UR: POSITIVE — AB

## 2017-02-01 NOTE — ED Provider Notes (Signed)
[facility]-EMERGENCY DEPT Provider Note   CSN: [id number] Arrival date & time: 02/01/17  0159     History   Chief Complaint Chief Complaint  Patient presents with  . Abdominal Pain    HPI Zoe-Rosenda Julin is a 21 y.o. female.  HPI Patient is a 21 year old female who reports lower abdominal cramping over the past several days.  Last normal menstrual period was 4 weeks ago.  No vaginal bleeding at this time.  No dysuria or urinary frequency.  Denies nausea vomiting.  No diarrhea.  Symptoms are mild in severity.  No lightheadedness or syncope   History reviewed. No pertinent past medical history.  There are no active problems to display for this patient.   History reviewed. No pertinent surgical history.  OB History    No data available       Home Medications    Prior to Admission medications   Medication Sig Start Date End Date Taking? Authorizing Provider  ibuprofen (ADVIL,MOTRIN) 200 MG tablet Take 400 mg by mouth every 6 (six) hours as needed for mild pain, moderate pain or cramping.   Yes [provider]  HYDROcodone-acetaminophen (NORCO/VICODIN) 5-325 MG tablet Take 1 tablet by mouth every 6 (six) hours as needed. Patient not taking: Reported on 02/01/2017 05/13/16   Tilden Fossa, MD  tamsulosin [facility]) 0.4 MG CAPS capsule Take 1 capsule (0.4 mg total) by mouth daily. Patient not taking: Reported on 02/01/2017 05/13/16   Tilden Fossa, MD    Family History History reviewed. No pertinent family history.  Social History Social History  Substance Use Topics  . Smoking status: Never Smoker  . Smokeless tobacco: Never Used  . Alcohol use No     Allergies   Mango flavor [flavoring agent]   Review of Systems Review of Systems  All other systems reviewed and are negative.    Physical Exam Updated Vital Signs BP 109/65 (BP Location: Right Arm)   Pulse 78   Temp 97.7 F (36.5 C) (Oral)   Resp 16   LMP 01/26/2017   SpO2 98%   Physical Exam    Constitutional: She is oriented to person, place, and time. She appears well-developed and well-nourished.  HENT:  Head: Normocephalic.  Eyes: EOM are normal.  Neck: Normal range of motion.  Cardiovascular: Normal rate.   Pulmonary/Chest: Effort normal.  Abdominal: She exhibits no distension. There is no tenderness.  Musculoskeletal: Normal range of motion.  Neurological: She is alert and oriented to person, place, and time.  Psychiatric: She has a normal mood and affect.  Nursing note and vitals reviewed.    ED Treatments / Results  Labs (all labs ordered are listed, but only abnormal results are displayed) Labs Reviewed  URINALYSIS, ROUTINE W REFLEX MICROSCOPIC - Abnormal; Notable for the following:       Result Value   Ketones, ur 5 (*)    Leukocytes, UA TRACE (*)    Squamous Epithelial / LPF 0-5 (*)    All other components within normal limits  PREGNANCY, URINE - Abnormal; Notable for the following:    Preg Test, Ur POSITIVE (*)    All other components within normal limits  HCG, QUANTITATIVE, PREGNANCY - Abnormal; Notable for the following:    hCG, Beta Chain, Quant, S 2,631 (*)    All other components within normal limits    EKG  EKG Interpretation None       Radiology US Ob Comp < 14 Wks  Result Date: 02/01/2017 CLINICAL DATA:  Lower  abdominal pain. Gestational age by last menstrual period 5 weeks and 0 days. EXAM: OBSTETRIC <14 WK US AND TRANSVAGINAL OB US TECHNIQUE: Both transabdominal and transvaginal ultrasound examinations were performed for complete evaluation of the gestation as well as the maternal uterus, adnexal regions, and pelvic cul-de-sac. Transvaginal technique was performed to assess early pregnancy. COMPARISON:  None. FINDINGS: Intrauterine gestational sac: Likely present. Yolk sac:  Not present. Embryo:  Not present. Cardiac Activity: Not applicable. MSD: 5 mm 5 w   2  d Subchorionic hemorrhage:  None visualized. Maternal uterus/adnexae: 2.3 cm  hypoechoic LEFT corpus luteal cyst. IMPRESSION: Probable early intrauterine gestational sac, but no yolk sac, fetal pole, or cardiac activity yet visualized. Recommend follow-up quantitative B-HCG levels and follow-up US in 14 days to assess viability. This recommendation follows SRU consensus guidelines: Diagnostic Criteria for Nonviable Pregnancy Early in the First Trimester. Malva Limes Engl J Med 2013; 409:8119-14; 369:1443-51. Electronically Signed   By: Awilda Metroourtnay  Bloomer M.D.   On: 02/01/2017 05:47   [place] Ob Transvaginal  Result Date: 02/01/2017 CLINICAL DATA:  Lower abdominal pain. Gestational age by last menstrual period 5 weeks and 0 days. EXAM: OBSTETRIC <14 WK US AND TRANSVAGINAL OB US TECHNIQUE: Both transabdominal and transvaginal ultrasound examinations were performed for complete evaluation of the gestation as well as the maternal uterus, adnexal regions, and pelvic cul-de-sac. Transvaginal technique was performed to assess early pregnancy. COMPARISON:  None. FINDINGS: Intrauterine gestational sac: Likely present. Yolk sac:  Not present. Embryo:  Not present. Cardiac Activity: Not applicable. MSD: 5 mm 5 w   2  d Subchorionic hemorrhage:  None visualized. Maternal uterus/adnexae: 2.3 cm hypoechoic LEFT corpus luteal cyst. IMPRESSION: Probable early intrauterine gestational sac, but no yolk sac, fetal pole, or cardiac activity yet visualized. Recommend follow-up quantitative B-HCG levels and follow-up US in 14 days to assess viability. This recommendation follows SRU consensus guidelines: Diagnostic Criteria for Nonviable Pregnancy Early in the First Trimester. Malva Limes Engl J Med 2013; 782:9562-13; 369:1443-51. Electronically Signed   By: Awilda Metroourtnay  Bloomer M.D.   On: 02/01/2017 05:47    Procedures Procedures (including critical care time)  Medications Ordered in ED Medications - No data to display   Initial Impression / Assessment and Plan / ED Course  I have reviewed the triage vital signs and the nursing notes.  Pertinent labs  & imaging results that were available during my care of the patient were reviewed by me and considered in my medical decision making (see chart for details).     Early first trimester pregnancy.  No free fluid or adnexal masses noted on ultrasound.  Patient will need follow-up ultrasound and beta hCG.  [facility].  Patient understands return to the ER for new or worsening symptoms.  Standard prenatal precautions given  Final Clinical Impressions(s) / ED Diagnoses   Final diagnoses:  Lower abdominal pain  First trimester pregnancy    New Prescriptions New Prescriptions   No medications on file     Azalia Bilisampos, [place], MD 02/01/17 [phone]

## 2017-02-01 NOTE — ED Triage Notes (Signed)
Pt complains of cramping abdominal pain for wo weeks Pt denies any vomiting or diarrhea

## 2017-03-31 ENCOUNTER — Other Ambulatory Visit (HOSPITAL_COMMUNITY)
Admission: RE | Admit: 2017-03-31 | Discharge: 2017-03-31 | Disposition: A | Discharge status: Home / Self Care | Payer: Medicaid Other | Source: Ambulatory Visit | Attending: Advanced Practice Midwife | Admitting: Advanced Practice Midwife

## 2017-03-31 ENCOUNTER — Ambulatory Visit (INDEPENDENT_AMBULATORY_CARE_PROVIDER_SITE_OTHER): Payer: Medicaid Other | Admitting: Advanced Practice Midwife

## 2017-03-31 ENCOUNTER — Encounter: Payer: Self-pay | Admitting: Advanced Practice Midwife

## 2017-03-31 DIAGNOSIS — Z331 Pregnant state, incidental: Secondary | ICD-10-CM

## 2017-03-31 DIAGNOSIS — Z3402 Encounter for supervision of normal first pregnancy, second trimester: Secondary | ICD-10-CM | POA: Diagnosis not present

## 2017-03-31 DIAGNOSIS — Z124 Encounter for screening for malignant neoplasm of cervix: Secondary | ICD-10-CM | POA: Diagnosis not present

## 2017-03-31 DIAGNOSIS — Z3401 Encounter for supervision of normal first pregnancy, first trimester: Secondary | ICD-10-CM | POA: Insufficient documentation

## 2017-03-31 DIAGNOSIS — Z113 Encounter for screening for infections with a predominantly sexual mode of transmission: Secondary | ICD-10-CM

## 2017-03-31 DIAGNOSIS — Z3400 Encounter for supervision of normal first pregnancy, unspecified trimester: Secondary | ICD-10-CM | POA: Insufficient documentation

## 2017-03-31 LAB — POCT URINALYSIS DIP (DEVICE)
Bilirubin Urine: NEGATIVE
GLUCOSE, UA: NEGATIVE mg/dL
Ketones, ur: NEGATIVE mg/dL
Leukocytes, UA: NEGATIVE
NITRITE: NEGATIVE
Protein, ur: NEGATIVE mg/dL
Specific Gravity, Urine: 1.02 (ref 1.005–1.030)
UROBILINOGEN UA: 0.2 mg/dL (ref 0.0–1.0)
pH: 6.5 (ref 5.0–8.0)

## 2017-03-31 MED ORDER — PRENATAL 27-0.8 MG PO TABS: 1.0000 | ORAL_TABLET | Freq: Every day | ORAL | 0 refills | Status: DC

## 2017-03-31 MED ORDER — ASPIRIN EC 81 MG PO TBEC: 81.0000 mg | DELAYED_RELEASE_TABLET | Freq: Every day | ORAL | 10 refills | Status: DC

## 2017-03-31 NOTE — Progress Notes (Signed)
First Trimester Screen scheduled for November 9th @ 0915.  Anatomy US scheduled for December 18th @ 0800.  Pt notified.

## 2017-03-31 NOTE — Patient Instructions (Signed)
AREA PEDIATRIC/FAMILY PRACTICE PHYSICIANS  [facility] [address], [address] [place], [place]  [address] Phone - [phone]   Fax - [phone]  [facility] [address] [place], [place] [address] Phone - [phone]   Fax - [phone]  [facility] [address] [place], [place]  [address] Phone - [phone]   Fax - [phone]  [facility] [address] [place], [place]  [address] Phone - [phone]   Fax - [phone]  [facility] [address] [place], [place]  [address] Phone - [phone]   Fax - [phone]  [facility] [address] [place], [place]  [address] Phone - [phone]   Fax - [phone]  [facility] [address] [place], [place]  [address] Phone - [phone]   Fax - [phone]  [facility] [address], [address] [place], [place]  [address] Phone - [phone]   Fax - [phone]  [facility] [address] [place], [place]  [address] Phone - [phone]   Fax - [phone] [facility] [address] [place], [place]  [address] Phone - [phone]   Fax - [phone]  [facility] [address] [place], [place]  [address] Phone - [phone]   Fax - [phone]  [facility] [address] [place], [place]  [address] Phone - [phone]   Fax - [phone]  [facility] [address] [place], [place]  [address] Phone - [phone]   Fax - [phone]  SHILPA GOSRANI [address] [place], [place]  [address] Phone - [phone]  [facility] [address] [place], [place]  [address] Phone - [phone]   Fax - [phone]  [facility] [address] [place], [place]  [address] Phone - [phone]   Fax - [phone]  [facility] [address] [place], [place]  [address] Phone - [phone]   Fax - [phone]  [facility] [address] [place], [place]  [address] Phone - [phone]   Fax - [phone]   [facility] [address] [place], [place]  [address] Phone - [phone]   Fax - [phone]  [facility] [address] [place], [place]  [address] Phone - [phone]   Fax - [phone]  DAVID RUBIN [address] [place], [place]  [address] Phone - [phone]   Fax - [phone]  [facility] [address] , [place]  [address] Phone - [phone]   Fax - [phone]  [facility] [address] , [place]  [address] Phone - [phone]   Fax - [phone] [facility] [address] [place], [place]  [address] Phone - [phone]   Fax - [phone]  [facility] [address] [place], [place]  [address] Phone - [phone]   Fax - [phone]  [facility] [address] [place], [place]  [address] Phone - [phone]   Fax - [phone]  [facility] Charlene Flemming MD [address]  [place] [address] Phone [phone]  Fax [phone]   

## 2017-03-31 NOTE — Progress Notes (Signed)
Pt stated having cramping pain sometimes.Pt wants baby script app

## 2017-03-31 NOTE — Addendum Note (Signed)
Addended by: Garret ReddishBARNES, Demetri Goshert M on: 03/31/2017 02:51 PM   Modules accepted: Orders

## 2017-03-31 NOTE — Progress Notes (Signed)
  Subjective:    Adrienne Craig is being seen today for her first obstetrical visit.  This is not a planned pregnancy. She is at 6273w1d gestation. Relationship with FOB: significant other, not living together. Patient does intend to breast feed. Pregnancy history fully reviewed.  Patient reports no complaints.  Review of Systems:   Review of Systems  Constitutional: Negative.  Negative for fatigue and fever.  HENT: Negative.   Respiratory: Negative.  Negative for shortness of breath.   Cardiovascular: Negative.  Negative for chest pain.  Gastrointestinal: Negative.  Negative for abdominal pain, constipation, diarrhea, nausea and vomiting.  Genitourinary: Negative for dysuria, vaginal bleeding and vaginal discharge.  Neurological: Negative.  Negative for dizziness and headaches.    Objective:     BP 122/69   Pulse 74   Wt 177 lb 8 oz (80.5 kg)   LMP 12/29/2016 (Exact Date)   BMI 32.47 kg/m  Physical Exam  Nursing note and vitals reviewed. Constitutional: She is oriented to person, place, and time. She appears well-developed and well-nourished. No distress.  HENT:  Head: Normocephalic.  Eyes: Pupils are equal, round, and reactive to light.  Cardiovascular: Normal rate, regular rhythm and normal heart sounds.  Respiratory: Effort normal and breath sounds normal. No respiratory distress.  GI: Soft. Bowel sounds are normal. She exhibits no distension. There is no tenderness.  Neurological: She is alert and oriented to person, place, and time.  Skin: Skin is warm and dry.  Psychiatric: She has a normal mood and affect. Her behavior is normal. Judgment and thought content normal.    Pelvic exam: Cervix pink, visually closed, without lesion, scant white creamy discharge, vaginal walls and external genitalia normal Bimanual exam: Cervix 0/long/high, firm, anterior, neg CMT, uterus nontender, adnexa without tenderness, enlargement, or mass   Exam FHR: 160 bpm   Assessment:    Pregnancy: G1P0 Patient Active Problem List   Diagnosis Date Noted  . Supervision of normal first pregnancy, antepartum 03/31/2017    . Plan:      1. Supervision of normal first pregnancy, antepartum    -Initial labs drawn. -Prenatal vitamins. -ASA started per UptoDate criteria- nulliparity and BMI >30 -Problem list reviewed and updated. -Early 1hr GTT -Pap today -AFP3 discussed: requested. -Role of ultrasound in pregnancy discussed; fetal survey: requested. Follow up in 4 weeks.  Rolm BookbinderCaroline M Neill CNM 03/31/2017  1:28 PM

## 2017-04-01 LAB — OBSTETRIC PANEL, INCLUDING HIV
ANTIBODY SCREEN: NEGATIVE
BASOS: 0 %
Basophils Absolute: 0 10*3/uL (ref 0.0–0.2)
EOS (ABSOLUTE): 0.1 10*3/uL (ref 0.0–0.4)
EOS: 1 %
HEMATOCRIT: 36.7 % (ref 34.0–46.6)
HIV SCREEN 4TH GENERATION: NONREACTIVE
Hemoglobin: 12.4 g/dL (ref 11.1–15.9)
Hepatitis B Surface Ag: NEGATIVE
Immature Grans (Abs): 0 10*3/uL (ref 0.0–0.1)
Immature Granulocytes: 0 %
LYMPHS ABS: 1.8 10*3/uL (ref 0.7–3.1)
Lymphs: 23 %
MCH: 31.3 pg (ref 26.6–33.0)
MCHC: 33.8 g/dL (ref 31.5–35.7)
MCV: 93 fL (ref 79–97)
MONOS ABS: 0.3 10*3/uL (ref 0.1–0.9)
Monocytes: 3 %
Neutrophils Absolute: 5.6 10*3/uL (ref 1.4–7.0)
Neutrophils: 73 %
Platelets: 191 10*3/uL (ref 150–379)
RBC: 3.96 x10E6/uL (ref 3.77–5.28)
RDW: 12.7 % (ref 12.3–15.4)
RH TYPE: POSITIVE
RPR Ser Ql: NONREACTIVE
Rubella Antibodies, IGG: <0.9 index — ABNORMAL LOW (ref >0.99)
WBC: 7.8 10*3/uL (ref 3.4–10.8)

## 2017-04-01 LAB — CYTOLOGY - PAP
Chlamydia: NEGATIVE
Diagnosis: NEGATIVE
Neisseria Gonorrhea: NEGATIVE

## 2017-04-01 LAB — HEMOGLOBINOPATHY EVALUATION
FERRITIN: 77 ng/mL (ref 15–150)
HGB SOLUBILITY: NEGATIVE
HGB VARIANT: 0 %
Hgb A2 Quant: 2.4 % (ref 1.8–3.2)
Hgb A: 97.6 % (ref 96.4–98.8)
Hgb C: 0 %
Hgb F Quant: 0 % (ref 0.0–2.0)
Hgb S: 0 %

## 2017-04-01 LAB — GLUCOSE TOLERANCE, 1 HOUR: Glucose, 1Hr PP: 92 mg/dL (ref 65–199)

## 2017-04-03 LAB — URINE CULTURE, OB REFLEX: Organism ID, Bacteria: NO GROWTH

## 2017-04-03 LAB — CULTURE, OB URINE

## 2017-04-04 ENCOUNTER — Ambulatory Visit (HOSPITAL_COMMUNITY): Payer: Medicaid Other

## 2017-04-04 ENCOUNTER — Ambulatory Visit (HOSPITAL_COMMUNITY)
Admission: RE | Admit: 2017-04-04 | Discharge: 2017-04-04 | Disposition: A | Discharge status: Home / Self Care | Payer: Medicaid Other | Source: Ambulatory Visit | Attending: Advanced Practice Midwife | Admitting: Advanced Practice Midwife

## 2017-04-04 ENCOUNTER — Other Ambulatory Visit: Payer: Self-pay | Admitting: Advanced Practice Midwife

## 2017-04-04 ENCOUNTER — Ambulatory Visit (HOSPITAL_COMMUNITY): Admission: RE | Admit: 2017-04-04 | Payer: Medicaid Other | Source: Ambulatory Visit

## 2017-04-04 ENCOUNTER — Other Ambulatory Visit (HOSPITAL_COMMUNITY): Payer: Medicaid Other

## 2017-04-04 ENCOUNTER — Encounter (HOSPITAL_COMMUNITY): Payer: Self-pay

## 2017-04-04 DIAGNOSIS — Z3A14 14 weeks gestation of pregnancy: Secondary | ICD-10-CM | POA: Insufficient documentation

## 2017-04-04 DIAGNOSIS — Z3682 Encounter for antenatal screening for nuchal translucency: Secondary | ICD-10-CM

## 2017-04-04 DIAGNOSIS — Z3400 Encounter for supervision of normal first pregnancy, unspecified trimester: Secondary | ICD-10-CM

## 2017-04-04 NOTE — Addendum Note (Signed)
Encounter addended by: Genevie CheshireWaken, Malic Rosten M, RT on: 04/04/2017 1:51 PM  Actions taken: Imaging Exam ended

## 2017-04-04 NOTE — Addendum Note (Signed)
Encounter addended by: Vivien RotaSmall, Jermany Rimel H, RT on: 04/04/2017 1:51 PM  Actions taken: Imaging Exam ended

## 2017-04-07 ENCOUNTER — Encounter: Payer: Self-pay | Admitting: Family Medicine

## 2017-04-07 DIAGNOSIS — O09899 Supervision of other high risk pregnancies, unspecified trimester: Secondary | ICD-10-CM | POA: Insufficient documentation

## 2017-04-07 DIAGNOSIS — Z283 Underimmunization status: Secondary | ICD-10-CM | POA: Insufficient documentation

## 2017-04-07 DIAGNOSIS — O9989 Other specified diseases and conditions complicating pregnancy, childbirth and the puerperium: Secondary | ICD-10-CM

## 2017-04-10 LAB — CYSTIC FIBROSIS MUTATION 97: GENE DIS ANAL CARRIER INTERP BLD/T-IMP: NOT DETECTED

## 2017-04-23 ENCOUNTER — Encounter: Payer: Medicaid Other | Admitting: Nurse Practitioner

## 2017-04-23 ENCOUNTER — Ambulatory Visit (INDEPENDENT_AMBULATORY_CARE_PROVIDER_SITE_OTHER): Payer: Medicaid Other | Admitting: Nurse Practitioner

## 2017-04-23 VITALS — BP 92/39 | HR 63 | Wt 180.0 lb

## 2017-04-23 DIAGNOSIS — Z1379 Encounter for other screening for genetic and chromosomal anomalies: Secondary | ICD-10-CM

## 2017-04-23 DIAGNOSIS — Z3400 Encounter for supervision of normal first pregnancy, unspecified trimester: Secondary | ICD-10-CM

## 2017-04-23 DIAGNOSIS — Z3402 Encounter for supervision of normal first pregnancy, second trimester: Secondary | ICD-10-CM

## 2017-04-23 NOTE — Progress Notes (Signed)
    Subjective:  Adrienne Craig is a 21 y.o. G1P0 at 5233w0d being seen today for ongoing prenatal care.  She is currently monitored for the following issues for this low-risk pregnancy and has Supervision of normal first pregnancy, antepartum and Rubella non-immune status, antepartum on their problem list.  Patient reports no complaints.  Contractions: Not present. Vag. Bleeding: None.  Movement: Present. Denies leaking of fluid. Client did not mention pain or pressure to examiner today.  The following portions of the patient's history were reviewed and updated as appropriate: allergies, current medications, past family history, past medical history, past social history, past surgical history and problem list. Problem list updated.  Objective:   Vitals:   04/23/17 1621  BP: (!) 92/39  Pulse: 63  Weight: 180 lb (81.6 kg)    Fetal Status: Fetal Heart Rate (bpm): 150 Fundal Height: 17 cm Movement: Present     General:  Alert, oriented and cooperative. Patient is in no acute distress.  Skin: Skin is warm and dry. No rash noted.   Cardiovascular: Normal heart rate noted  Respiratory: Normal respiratory effort, no problems with respiration noted  Abdomen: Soft, gravid, appropriate for gestational age. Pain/Pressure: Present     Pelvic:  Cervical exam deferred        Extremities: Normal range of motion.  Edema: None  Mental Status: Normal mood and affect. Normal behavior. Normal judgment and thought content.     Assessment and Plan:  Pregnancy: G1P0 at 5633w0d Supervision of normal first pregnancy - desires a waterbirth.  Given written info on waterbirth and instructed to sign up for classes for waterbirth.  Quad screen done today  Has appointment scheduled December 18 for anatomy ultrasound.  Preterm labor symptoms and general obstetric precautions including but not limited to vaginal bleeding, contractions, leaking of fluid and fetal movement were reviewed in detail with the  patient. Please refer to After Visit Summary for other counseling recommendations.  Return in about 4 weeks (around 05/21/2017).  Nolene BernheimERRI BURLESON, RN, MSN, NP-BC Nurse Practitioner, [facility], [facility] 04/23/2017 5:13 PM

## 2017-04-26 LAB — AFP TETRA
DIA Mom Value: 0.67
DIA Value (EIA): 100.37 pg/mL
DSR (BY AGE) 1 IN: 1130
DSR (Second Trimester) 1 IN: 10000
Gestational Age: 17 WEEKS
MSAFP MOM: 0.9
MSAFP: 29.5 ng/mL
MSHCG MOM: 0.34
MSHCG: 9447 m[IU]/mL
Maternal Age At EDD: 21.9 yr
Osb Risk: 10000
TEST RESULTS AFP: NEGATIVE
Weight: 180 [lb_av]
uE3 Mom: 0.87
uE3 Value: 0.86 ng/mL

## 2017-04-28 ENCOUNTER — Encounter: Payer: Medicaid Other | Admitting: Family Medicine

## 2017-05-13 ENCOUNTER — Other Ambulatory Visit: Payer: Self-pay | Admitting: Advanced Practice Midwife

## 2017-05-13 ENCOUNTER — Ambulatory Visit (HOSPITAL_COMMUNITY)
Admission: RE | Admit: 2017-05-13 | Discharge: 2017-05-13 | Disposition: A | Discharge status: Home / Self Care | Payer: Medicaid Other | Source: Ambulatory Visit | Attending: Advanced Practice Midwife | Admitting: Advanced Practice Midwife

## 2017-05-13 ENCOUNTER — Ambulatory Visit (INDEPENDENT_AMBULATORY_CARE_PROVIDER_SITE_OTHER): Payer: Medicaid Other | Admitting: Advanced Practice Midwife

## 2017-05-13 VITALS — BP 105/64 | HR 75 | Wt 181.0 lb

## 2017-05-13 DIAGNOSIS — Z3400 Encounter for supervision of normal first pregnancy, unspecified trimester: Secondary | ICD-10-CM

## 2017-05-13 DIAGNOSIS — Z3689 Encounter for other specified antenatal screening: Secondary | ICD-10-CM | POA: Insufficient documentation

## 2017-05-13 DIAGNOSIS — Z369 Encounter for antenatal screening, unspecified: Secondary | ICD-10-CM

## 2017-05-13 DIAGNOSIS — Z3A19 19 weeks gestation of pregnancy: Secondary | ICD-10-CM

## 2017-05-13 DIAGNOSIS — E669 Obesity, unspecified: Secondary | ICD-10-CM | POA: Insufficient documentation

## 2017-05-13 DIAGNOSIS — O99212 Obesity complicating pregnancy, second trimester: Secondary | ICD-10-CM

## 2017-05-13 DIAGNOSIS — R070 Pain in throat: Secondary | ICD-10-CM

## 2017-05-13 MED ORDER — RANITIDINE HCL 150 MG PO TABS: 150.0000 mg | ORAL_TABLET | Freq: Two times a day (BID) | ORAL | 2 refills | Status: DC

## 2017-05-13 NOTE — Patient Instructions (Signed)
Considering Waterbirth? Guide for patients at [facility]  Why consider waterbirth?  . Gentle birth for babies . Less pain medicine used in labor . May allow for passive descent/less pushing . May reduce perineal tears  . More mobility and instinctive maternal position changes . Increased maternal relaxation . Reduced blood pressure in labor  Is waterbirth safe? What are the risks of infection, drowning or other complications?  . Infection: o Very low risk (3.7 % for tub vs 4.8% for bed) o 7 in 8000 waterbirths with documented infection o Poorly cleaned equipment most common cause o Slightly lower group B strep transmission rate  . Drowning o Maternal:  - Very low risk   - Related to seizures or fainting o Newborn:  - Very low risk. No evidence of increased risk of respiratory problems in multiple large studies - Physiological protection from breathing under water - Avoid underwater birth if there are any fetal complications - Once baby's head is out of the water, keep it out.  . Birth complication o Some reports of cord trauma, but risk decreased by bringing baby to surface gradually o No evidence of increased risk of shoulder dystocia. Mothers can usually change positions faster in water than in a bed, possibly aiding the maneuvers to free the shoulder.   You must attend a Waterbirth class at [facility]  3rd Wednesday of every month from 7-9pm  Free  Register by calling [phone] or online at [email or web address]  Bring us the certificate from the class to your prenatal appointment  Meet with a midwife at 36 weeks to see if you can still plan a waterbirth and to sign the consent.   Purchase or rent the following supplies:   Water Birth Pool (Birth Pool in a Box or [place] for instance)  (Tubs start ~$125)  Single-use disposable tub liner designed for your brand of tub  New garden hose labeled "lead-free", "suitable for drinking  water",  Electric drain pump to remove water (We recommend 792 gallon per hour or greater pump.)   Separate garden hose to remove the dirty water  Fish net  Bathing suit top (optional)  Long-handled mirror (optional)  Places to purchase or rent supplies  Yourwaterbirth.com for tub purchases and supplies  Waterbirthsolutions.com for tub purchases and supplies  [employer] (www.thelaborladies.com) $275 for tub rental/set-up & take down/kit   [place] [employer] (http://www.[place].org/MeetUs.htm) Information regarding doulas (labor support) who provide pool rentals  Our practice has a Birth Pool in a Box tub at the hospital that you may borrow on a first-come-first-served basis. It is your responsibility to to set up, clean and break down the tub. We cannot guarantee the availability of this tub in advance. You are responsible for bringing all accessories listed above. If you do not have all necessary supplies you cannot have a waterbirth.    Things that would prevent you from having a waterbirth:  Premature, <37wks  Previous cesarean birth  Presence of thick meconium-stained fluid  Multiple gestation (Twins, triplets, etc.)  Uncontrolled diabetes or gestational diabetes requiring medication  Hypertension requiring medication or diagnosis of pre-eclampsia  Heavy vaginal bleeding  Non-reassuring fetal heart rate  Active infection (MRSA, etc.). Group B Strep is NOT a contraindication for  waterbirth.  If your labor has to be induced and induction method requires continuous  monitoring of the baby's heart rate  Other risks/issues identified by your obstetrical provider  Please remember that birth is unpredictable. Under certain unforeseeable   circumstances your provider may advise against giving birth in the tub. These decisions will be made on a case-by-case basis and with the safety of you and your baby as our highest priority.    

## 2017-05-13 NOTE — Progress Notes (Signed)
Pt states that she has been having cough and itchy throat, mostly at night.

## 2017-05-13 NOTE — Progress Notes (Signed)
   PRENATAL VISIT NOTE  Subjective:  Adrienne Craig is a 21 y.o. G1P0 at 2568w6d being seen today for ongoing prenatal care.  She is currently monitored for the following issues for this low-risk pregnancy and has Supervision of normal first pregnancy, antepartum and Rubella non-immune status, antepartum on their problem list.  Patient reports scratchy throat only at night when lying down.  Contractions: Not present. Vag. Bleeding: None.  Movement: Present. Denies leaking of fluid.   The following portions of the patient's history were reviewed and updated as appropriate: allergies, current medications, past family history, past medical history, past social history, past surgical history and problem list. Problem list updated.  Objective:   Vitals:   05/13/17 1427  BP: 105/64  Pulse: 75  Weight: 181 lb (82.1 kg)    Fetal Status: Fetal Heart Rate (bpm): 150   Movement: Present     General:  Alert, oriented and cooperative. Patient is in no acute distress.  Skin: Skin is warm and dry. No rash noted.   Cardiovascular: Normal heart rate noted  Respiratory: Normal respiratory effort, no problems with respiration noted  Abdomen: Soft, gravid, appropriate for gestational age.  Pain/Pressure: Absent     Pelvic: Cervical exam deferred        Extremities: Normal range of motion.     Mental Status:  Normal mood and affect. Normal behavior. Normal judgment and thought content.   Assessment and Plan:  Pregnancy: G1P0 at 4668w6d  1. Supervision of normal first pregnancy, antepartum --Reviewed normal US findings with pt.    2. Throat discomfort --May be acid reflux since only occurs when supine.  Try Zantac Q HS or may take BID to prevent or only occasionally with symptoms. - ranitidine (ZANTAC) 150 MG tablet; Take 1 tablet (150 mg total) by mouth 2 (two) times daily.  Dispense: 60 tablet; Refill: 2  Preterm labor symptoms and general obstetric precautions including but not limited to vaginal  bleeding, contractions, leaking of fluid and fetal movement were reviewed in detail with the patient. Please refer to After Visit Summary for other counseling recommendations.  Return in about 4 weeks (around 06/10/2017).   Sharen CounterLisa Leftwich-Kirby, CNM

## 2017-05-15 ENCOUNTER — Encounter: Payer: Self-pay | Admitting: *Deleted

## 2017-05-21 ENCOUNTER — Telehealth: Payer: Self-pay | Admitting: *Deleted

## 2017-05-21 NOTE — Telephone Encounter (Signed)
Adrienne Craig called 05/16/17 and left a message she thinks she has strep throat because her throat is sore, body hurting. Wants to know if she can get a test .  I called Adrienne Craig and she states she feels a lot better, no fever, not taking any sore throat medicine now but throat dry. She wanted to know why - I informed her many things can make her throat dry. Advised her she can go to Urgent care to be seen because we do not do primary care.  She voice understanding. Also adivsed her to force fluids

## 2017-05-27 NOTE — L&D Delivery Note (Signed)
Delivery Note  In to meet patient. Patient had been fully dilated since about 2 am and pushing for 3 hours. Caput just visible at introitus with pushes. Significant caput palpated with bony parts of head to +1/+2. Patient pushed effectively and slow descent made. Fetal heart rate in 110-120s with accels, overall reassuring. With significant maternal effort, infant head delivered from ROA position, shoulders did not easily deliver with gentle traction. Informed mother to stop pushing. Thick meconium noted with delivery of head.  Mc[place]ts attempted with no success, and I attempted to turn the baby without success. Suprapubic pressure was given which did not relieve the anterior shoulder. Mother rolled onto right side and I again attempted to turn the baby with pressure on the right anterior shoulder from anterior to posterior but no movement noted. I then hooked the left posterior shoulder under the axilla and was able to deliver the left posterior shoulder with gentle traction. After relief of the left shoulder, the right shoulder delivered easily with gentle downward traction. The infant was then hooked under both axillae and delivered with assistance as the abdomen was a tight squeeze. Infant with poor tone and color, cord immediately clamped and cut and infant placed in warmer for waiting pedi staff. Total length of dystocia 70 seconds.  Cord gases obtained. Placenta delivered intact, 3VC. 1st degree perineal laceration repaired with 2-0 Monocryl after injection of 1% lidocaine for patient discomfort. Fundus firm with pitocin and some clots expressed from lower uterine segment.   Infant required O2 and taken to NICU, please see note from pedi staff. After speaking with pediatrician, decreased movement of right arm noted on infant.   Infant delivered at 9:13 AM, complicated by shoulder dystocia.  APGAR: 2, 6, 8 ; weight 8 lb 1.1 oz (3660 g).   Placenta status: pathology Cord pH: 7.198  Anesthesia:    Episiotomy: None Lacerations: 1st degree;Perineal Suture Repair: 2.0 monocryl Est. Blood Loss (mL): 300  Mom to postpartum.  Baby to NICU.  Conan Bowens 10/03/2017, 9:40 AM

## 2017-05-27 NOTE — L&D Delivery Note (Addendum)
Delivery Note At 9:13 AM a viable and healthy female was delivered via Vaginal, Spontaneous (Presentation: cephalic; LOA  ).  APGAR: 2,6, 8;   Placenta status: spontaneous, intact .  Cord: 3 vessel. Complications: 70 second shoulder dystocia, resulting in code apgar. Baby able to be reduced out with gentle traction after patient rotated onto rightside. Please see resulting NICU note for full documentation of code apgar.   Cord pH: 7.198  Anesthesia:  epidural Episiotomy: None Lacerations: 1st degree;Perineal Suture Repair: 2-0 monocryl Est. Blood Loss (mL): 300  Mom to postpartum.  Baby to Couplet care / Skin to Skin.  Myrene Buddy 10/03/2017, 9:35 AM   Attestation of Attending Supervision of Provider:  Evaluation and management procedures were performed by this provider under my supervision and collaboration.  I have reviewed the provider's note and chart, and I agree with the management and plan.  Please see separate delivery note for further details.  Baldemar Lenis, M.D. Attending Obstetrician & Gynecologist, [facility] [employer], [facility]  10/04/2017 7:43 AM

## 2017-06-10 ENCOUNTER — Ambulatory Visit (INDEPENDENT_AMBULATORY_CARE_PROVIDER_SITE_OTHER): Payer: Medicaid Other | Admitting: Obstetrics & Gynecology

## 2017-06-10 VITALS — BP 108/55 | HR 66 | Wt 182.1 lb

## 2017-06-10 DIAGNOSIS — Z283 Underimmunization status: Secondary | ICD-10-CM

## 2017-06-10 DIAGNOSIS — O09899 Supervision of other high risk pregnancies, unspecified trimester: Secondary | ICD-10-CM

## 2017-06-10 DIAGNOSIS — Z3482 Encounter for supervision of other normal pregnancy, second trimester: Secondary | ICD-10-CM

## 2017-06-10 DIAGNOSIS — O9989 Other specified diseases and conditions complicating pregnancy, childbirth and the puerperium: Secondary | ICD-10-CM

## 2017-06-10 DIAGNOSIS — Z3400 Encounter for supervision of normal first pregnancy, unspecified trimester: Secondary | ICD-10-CM

## 2017-06-10 NOTE — Progress Notes (Signed)
   PRENATAL VISIT NOTE  Subjective:  Adrienne Craig is a 22 y.o. G1P0 at 3419w6d being seen today for ongoing prenatal care.  She is currently monitored for the following issues for this low-risk pregnancy and has Supervision of normal first pregnancy, antepartum and Rubella non-immune status, antepartum on their problem list.  Patient reports no complaints.  Contractions: Not present. Vag. Bleeding: None.  Movement: Present. Denies leaking of fluid.   The following portions of the patient's history were reviewed and updated as appropriate: allergies, current medications, past family history, past medical history, past social history, past surgical history and problem list. Problem list updated.  Objective:   Vitals:   06/10/17 1212  BP: (!) 108/55  Pulse: 66  Weight: 182 lb 1.6 oz (82.6 kg)    Fetal Status: Fetal Heart Rate (bpm): 152   Movement: Present     General:  Alert, oriented and cooperative. Patient is in no acute distress.  Skin: Skin is warm and dry. No rash noted.   Cardiovascular: Normal heart rate noted  Respiratory: Normal respiratory effort, no problems with respiration noted  Abdomen: Soft, gravid, appropriate for gestational age.  Pain/Pressure: Absent     Pelvic: Cervical exam deferred        Extremities: Normal range of motion.  Edema: Trace  Mental Status:  Normal mood and affect. Normal behavior. Normal judgment and thought content.   Assessment and Plan:  Pregnancy: G1P0 at 4219w6d  1. Supervision of normal first pregnancy, antepartum   2. Rubella non-immune status, antepartum Need immunization PP  Preterm labor symptoms and general obstetric precautions including but not limited to vaginal bleeding, contractions, leaking of fluid and fetal movement were reviewed in detail with the patient. Please refer to After Visit Summary for other counseling recommendations.  Return in about 4 weeks (around 07/08/2017).   Willodean Rosenthalarolyn Harraway-Smith, MD

## 2017-06-10 NOTE — Patient Instructions (Signed)
Second Trimester of Pregnancy The second trimester is from week 13 through week 28, month 4 through 6. This is often the time in pregnancy that you feel your best. Often times, morning sickness has lessened or quit. You may have more energy, and you may get hungry more often. Your unborn baby (fetus) is growing rapidly. At the end of the sixth month, he or she is about 9 inches long and weighs about 1 pounds. You will likely feel the baby move (quickening) between 18 and 20 weeks of pregnancy. Follow these instructions at home:  Avoid all smoking, herbs, and alcohol. Avoid drugs not approved by your doctor.  Do not use any tobacco products, including cigarettes, chewing tobacco, and electronic cigarettes. If you need help quitting, ask your doctor. You may get counseling or other support to help you quit.  Only take medicine as told by your doctor. Some medicines are safe and some are not during pregnancy.  Exercise only as told by your doctor. Stop exercising if you start having cramps.  Eat regular, healthy meals.  Wear a good support bra if your breasts are tender.  Do not use hot tubs, steam rooms, or saunas.  Wear your seat belt when driving.  Avoid raw meat, uncooked cheese, and liter boxes and soil used by cats.  Take your prenatal vitamins.  Take 1500-2000 milligrams of calcium daily starting at the 20th week of pregnancy until you deliver your baby.  Try taking medicine that helps you poop (stool softener) as needed, and if your doctor approves. Eat more fiber by eating fresh fruit, vegetables, and whole grains. Drink enough fluids to keep your pee (urine) clear or pale yellow.  Take warm water baths (sitz baths) to soothe pain or discomfort caused by hemorrhoids. Use hemorrhoid cream if your doctor approves.  If you have puffy, bulging veins (varicose veins), wear support hose. Raise (elevate) your feet for 15 minutes, 3-4 times a day. Limit salt in your diet.  Avoid heavy  lifting, wear low heals, and sit up straight.  Rest with your legs raised if you have leg cramps or low back pain.  Visit your dentist if you have not gone during your pregnancy. Use a soft toothbrush to brush your teeth. Be gentle when you floss.  You can have sex (intercourse) unless your doctor tells you not to.  Go to your doctor visits. Get help if:  You feel dizzy.  You have mild cramps or pressure in your lower belly (abdomen).  You have a nagging pain in your belly area.  You continue to feel sick to your stomach (nauseous), throw up (vomit), or have watery poop (diarrhea).  You have bad smelling fluid coming from your vagina.  You have pain with peeing (urination). Get help right away if:  You have a fever.  You are leaking fluid from your vagina.  You have spotting or bleeding from your vagina.  You have severe belly cramping or pain.  You lose or gain weight rapidly.  You have trouble catching your breath and have chest pain.  You notice sudden or extreme puffiness (swelling) of your face, hands, ankles, feet, or legs.  You have not felt the baby move in over an hour.  You have severe headaches that do not go away with medicine.  You have vision changes. This information is not intended to replace advice given to you by your health care provider. Make sure you discuss any questions you have with your health care   provider. Document Released: 08/07/2009 Document Revised: 10/19/2015 Document Reviewed: 07/14/2012 Elsevier Interactive Patient Education  2017 Elsevier Inc.  

## 2017-07-08 ENCOUNTER — Ambulatory Visit (INDEPENDENT_AMBULATORY_CARE_PROVIDER_SITE_OTHER): Payer: Medicaid Other

## 2017-07-08 ENCOUNTER — Other Ambulatory Visit: Payer: Self-pay | Admitting: *Deleted

## 2017-07-08 ENCOUNTER — Other Ambulatory Visit: Payer: Medicaid Other

## 2017-07-08 DIAGNOSIS — Z3403 Encounter for supervision of normal first pregnancy, third trimester: Secondary | ICD-10-CM

## 2017-07-08 DIAGNOSIS — Z3400 Encounter for supervision of normal first pregnancy, unspecified trimester: Secondary | ICD-10-CM

## 2017-07-08 NOTE — Progress Notes (Signed)
   PRENATAL VISIT NOTE  Subjective:  Adrienne Craig is a 22 y.o. G1P0 at 6750w6d being seen today for ongoing prenatal care.  She is currently monitored for the following issues for this low-risk pregnancy and has Supervision of normal first pregnancy, antepartum and Rubella non-immune status, antepartum on their problem list.  Patient reports no complaints.  Contractions: Not present. Vag. Bleeding: None.  Movement: Present. Denies leaking of fluid.   The following portions of the patient's history were reviewed and updated as appropriate: allergies, current medications, past family history, past medical history, past social history, past surgical history and problem list. Problem list updated.  Objective:   Vitals:   07/08/17 0936  BP: (!) 112/55  Pulse: 79  Weight: 185 lb 3.2 oz (84 kg)    Fetal Status: Fetal Heart Rate (bpm): 158 Fundal Height: 28 cm Movement: Present     General:  Alert, oriented and cooperative. Patient is in no acute distress.  Skin: Skin is warm and dry. No rash noted.   Cardiovascular: Normal heart rate noted  Respiratory: Normal respiratory effort, no problems with respiration noted  Abdomen: Soft, gravid, appropriate for gestational age.  Pain/Pressure: Present     Pelvic: Cervical exam deferred        Extremities: Normal range of motion.  Edema: None  Mental Status:  Normal mood and affect. Normal behavior. Normal judgment and thought content.   Assessment and Plan:  Pregnancy: G1P0 at 3550w6d  1. Supervision of normal first pregnancy, antepartum -No complaints. Routine care -Patient unsure about Tdap, information given -No longer desires a waterbirth, reviewed concerns and patient does not want to continue with plan for waterbirth   -2hr GTT  -RPR -CBC -HIV antibody  Preterm labor symptoms and general obstetric precautions including but not limited to vaginal bleeding, contractions, leaking of fluid and fetal movement were reviewed in detail  with the patient. Please refer to After Visit Summary for other counseling recommendations.  Return in about 2 weeks (around 07/22/2017) for Return OB visit.   Rolm BookbinderCaroline M Sallee Hogrefe, CNM  07/08/17 10:03 AM

## 2017-07-08 NOTE — Patient Instructions (Signed)
[facility] [place] [address], [address] [place], [place]  [address] Phone - [phone]   Fax - [phone]  [facility] [address] [place], [place] [address] Phone - [phone]   Fax - [place] [address] [place], [place]  [address] Phone - [phone]   Fax - [phone]  [place] [address] [place] [address] [place], [place]  [address] Phone - [phone]   Fax - [phone]  [place] [address] [place], [place]  [address] Phone - [phone]   Fax - [phone]  [facility] [address] [place], [place]  [address] Phone - [phone]   Fax - [place] [address], [place], [place]  [address] Phone - [phone]   Fax - [phone]  [place] [address] [place], [place] [address] [place], [place]  [address] Phone - [phone]   Fax - [place] [address] [place], [place]  [address] Phone - [phone]   Fax - [phone] [facility] [place] [address] [place], [place]  [address] Phone - [phone]   Fax - [phone]  [facility] [place] [address] [place], [place]  [address] Phone - [phone]   Fax - [phone]  [facility] [place], [place], [place]  [address] Phone - [phone]   Fax - [place] [address] [place], [place]  [address] Phone - [phone]   Fax - [phone]  [facility] [address], [place], [place]  [address] Phone - [place], [place]  [address] Phone - [phone]   Fax - [place] [address] [place], [place]  [address] Phone - [phone]   Fax - [phone]  [place] [address] [place], [place]  [address] Phone - [phone]   Fax - [place]. [place], [place]  [address] Phone - [phone]   Fax - [place], [place], [place]  [address] Phone - [phone]   Fax - [place] [address], [place], [place]  [address] Phone - [phone]   Fax - [phone]  [facility] [address], [place], [place]  [address] Phone - [phone]   Fax - [place] [address] [place], [place]  [address] Phone - [phone]   Fax - [phone]  [place] [address] [place], [place]  [address] Phone - [phone]   Fax - [phone] [place] [address] [place], [place]  [address] Phone - [phone]   Fax - [place] [address] [place], [place]  [address] Phone - [phone]   Fax - [place] [address], [place],   [address] Phone - [phone]   Fax - [phone]  [place] [address] [place] [address] Phone [phone]  Fax [phone]  CIRCUMCISION  Circumcision is considered an elective/non-medically necessary procedure. There are many reasons parents decide  to have their sons circumsized. During the first year of life circumcised males have a reduced risk of urinary tract infections but after this year the rates between circumcised males and uncircumcised males are the same.  It is safe to have your son circumcised outside of the hospital and the places above perform them regularly.    Places to have your son  circumcised:    [facility] [phone] $480 by 4 wks  [facility] [phone] $244 by 4 wks  [facility] [phone] $175 by 2 wks  [facility] [phone] $250 by 7 days [facility] [phone] $269 by 4 wks  These prices sometimes change but are roughly what you can expect to pay. Please call and confirm pricing.    Safe Medications in Pregnancy   Acne: Benzoyl Peroxide Salicylic Acid  Backache/Headache: Tylenol: 2 regular strength every 4 hours OR              2 Extra strength every 6 hours  Colds/Coughs/Allergies: Benadryl (alcohol free) 25 mg every 6 hours as needed Breath right strips Claritin Cepacol throat lozenges Chloraseptic throat spray Cold-Eeze- up to three times per day Cough drops, alcohol free Flonase (by prescription only) Guaifenesin Mucinex Robitussin DM (plain only, alcohol free) Saline nasal spray/drops Sudafed (pseudoephedrine) & Actifed ** use only after [redacted] weeks gestation and if you do not have high blood pressure Tylenol Vicks Vaporub Zinc lozenges Zyrtec   Constipation: Colace Ducolax suppositories Fleet enema Glycerin suppositories Metamucil Milk of magnesia Miralax Senokot Smooth move tea  Diarrhea: Kaopectate Imodium A-D  *NO pepto Bismol  Hemorrhoids: Anusol Anusol HC Preparation H Tucks  Indigestion: Tums Maalox Mylanta Zantac  Pepcid  Insomnia: Benadryl (alcohol free) 25mg  every 6 hours as needed Tylenol PM Unisom, no Gelcaps  Leg Cramps: Tums MagGel  Nausea/Vomiting:  Bonine Dramamine Emetrol Ginger extract Sea bands Meclizine  Nausea medication to take during pregnancy:  Unisom (doxylamine succinate 25 mg tablets) Take one tablet daily at bedtime. If symptoms are not adequately controlled, the dose can be increased to a maximum  recommended dose of two tablets daily (1/2 tablet in the morning, 1/2 tablet mid-afternoon and one at bedtime). Vitamin B6 100mg  tablets. Take one tablet twice a day (up to 200 mg per day).  Skin Rashes: Aveeno products Benadryl cream or 25mg  every 6 hours as needed Calamine Lotion 1% cortisone cream  Yeast infection: Gyne-lotrimin 7 Monistat 7   **If taking multiple medications, please check labels to avoid duplicating the same active ingredients **take medication as directed on the label ** Do not exceed 4000 mg of tylenol in 24 hours **Do not take medications that contain aspirin or ibuprofen

## 2017-07-09 ENCOUNTER — Other Ambulatory Visit: Payer: Self-pay

## 2017-07-09 DIAGNOSIS — Z3400 Encounter for supervision of normal first pregnancy, unspecified trimester: Secondary | ICD-10-CM

## 2017-07-09 LAB — CBC
Hematocrit: 36.7 % (ref 34.0–46.6)
Hemoglobin: 12.8 g/dL (ref 11.1–15.9)
MCH: 32.3 pg (ref 26.6–33.0)
MCHC: 34.9 g/dL (ref 31.5–35.7)
MCV: 93 fL (ref 79–97)
Platelets: 180 10*3/uL (ref 150–379)
RBC: 3.96 x10E6/uL (ref 3.77–5.28)
RDW: 13.2 % (ref 12.3–15.4)
WBC: 9.9 10*3/uL (ref 3.4–10.8)

## 2017-07-09 LAB — HIV ANTIBODY (ROUTINE TESTING W REFLEX): HIV SCREEN 4TH GENERATION: NONREACTIVE

## 2017-07-09 LAB — GLUCOSE TOLERANCE, 2 HOURS W/ 1HR
GLUCOSE, FASTING: 69 mg/dL (ref 65–91)
Glucose, 1 hour: 116 mg/dL (ref 65–179)
Glucose, 2 hour: 91 mg/dL (ref 65–152)

## 2017-07-09 LAB — RPR: RPR: NONREACTIVE

## 2017-07-23 ENCOUNTER — Ambulatory Visit (INDEPENDENT_AMBULATORY_CARE_PROVIDER_SITE_OTHER): Payer: Medicaid Other | Admitting: Family Medicine

## 2017-07-23 VITALS — BP 105/62 | HR 84 | Wt 188.0 lb

## 2017-07-23 DIAGNOSIS — Z3403 Encounter for supervision of normal first pregnancy, third trimester: Secondary | ICD-10-CM | POA: Diagnosis not present

## 2017-07-23 DIAGNOSIS — Z3400 Encounter for supervision of normal first pregnancy, unspecified trimester: Secondary | ICD-10-CM

## 2017-07-23 DIAGNOSIS — Z23 Encounter for immunization: Secondary | ICD-10-CM | POA: Diagnosis not present

## 2017-07-23 NOTE — Progress Notes (Signed)
   PRENATAL VISIT NOTE  Subjective:  Adrienne Craig is a 22 y.o. G1P0 at 1452w0d being seen today for ongoing prenatal care.  She is currently monitored for the following issues for this low-risk pregnancy and has Supervision of normal first pregnancy, antepartum and Rubella non-immune status, antepartum on their problem list.  Patient reports no complaints.  Contractions: Not present. Vag. Bleeding: None.  Movement: Present. Denies leaking of fluid.   The following portions of the patient's history were reviewed and updated as appropriate: allergies, current medications, past family history, past medical history, past social history, past surgical history and problem list. Problem list updated.  Objective:   Vitals:   07/23/17 1520  BP: 105/62  Pulse: 84  Weight: 85.3 kg (188 lb)    Fetal Status: Fetal Heart Rate (bpm): 159 Fundal Height: 31 cm Movement: Present     General:  Alert, oriented and cooperative. Patient is in no acute distress.  Skin: Skin is warm and dry. No rash noted.   Cardiovascular: Normal heart rate noted  Respiratory: Normal respiratory effort, no problems with respiration noted  Abdomen: Soft, gravid, appropriate for gestational age.  Pain/Pressure: Present     Pelvic: Cervical exam deferred        Extremities: Normal range of motion.  Edema: Trace  Mental Status:  Normal mood and affect. Normal behavior. Normal judgment and thought content.   Assessment and Plan:  Pregnancy: G1P0 at 4152w0d  1. Supervision of normal first pregnancy, antepartum - Tdap vaccine greater than or equal to 7yo IM  Preterm labor symptoms and general obstetric precautions including but not limited to vaginal bleeding, contractions, leaking of fluid and fetal movement were reviewed in detail with the patient. Please refer to After Visit Summary for other counseling recommendations.  Return in about 2 weeks (around 08/06/2017).   Rolm BookbinderAmber Kyndahl Jablon, DO

## 2017-07-23 NOTE — Progress Notes (Signed)
Discussed weight gain.

## 2017-07-23 NOTE — Patient Instructions (Signed)

## 2017-08-07 ENCOUNTER — Ambulatory Visit (INDEPENDENT_AMBULATORY_CARE_PROVIDER_SITE_OTHER): Payer: Medicaid Other | Admitting: Student

## 2017-08-07 DIAGNOSIS — Z3400 Encounter for supervision of normal first pregnancy, unspecified trimester: Secondary | ICD-10-CM

## 2017-08-07 NOTE — Progress Notes (Signed)
   PRENATAL VISIT NOTE  Subjective:  Adrienne Craig is a 22 y.o. G1P0 at 7635w1d being seen today for ongoing prenatal care.  She is currently monitored for the following issues for this low-risk pregnancy and has Supervision of normal first pregnancy, antepartum and Rubella non-immune status, antepartum on their problem list.  Patient reports no complaints. Patient has elevated GAD but states that she is anxious about her upcoming baby shower. She declines to see Asher MuirJamie today.  Contractions: Not present. Vag. Bleeding: None.  Movement: Present. Denies leaking of fluid.   The following portions of the patient's history were reviewed and updated as appropriate: allergies, current medications, past family history, past medical history, past social history, past surgical history and problem list. Problem list updated.  Objective:   Vitals:   08/07/17 1541  BP: 107/71  Pulse: 86  Weight: 191 lb (86.6 kg)    Fetal Status: Fetal Heart Rate (bpm): 153 Fundal Height: 32 cm Movement: Present     General:  Alert, oriented and cooperative. Patient is in no acute distress.  Skin: Skin is warm and dry. No rash noted.   Cardiovascular: Normal heart rate noted  Respiratory: Normal respiratory effort, no problems with respiration noted  Abdomen: Soft, gravid, appropriate for gestational age.  Pain/Pressure: Present     Pelvic: Cervical exam deferred        Extremities: Normal range of motion.  Edema: Trace  Mental Status:  Normal mood and affect. Normal behavior. Normal judgment and thought content.   Assessment and Plan:  Pregnancy: G1P0 at 2435w1d  1. Supervision of normal first pregnancy, antepartum -Discussed natural family planning -Doing well, excited about becoming a mom.   Preterm labor symptoms and general obstetric precautions including but not limited to vaginal bleeding, contractions, leaking of fluid and fetal movement were reviewed in detail with the patient. Please refer to After  Visit Summary for other counseling recommendations.  Return in about 2 weeks (around 08/21/2017).   Marylene LandKathryn Lorraine Efton Thomley, CNM

## 2017-08-07 NOTE — Patient Instructions (Signed)
Natural Family Planning Introduction Natural Family Planning (NFP) is a type of birth control without using any form of contraception. Women who use NFP should not have sexual intercourse when the ovary produces an egg (ovulation) during the menstrual cycle. The NFP method is safe and can prevent pregnancy. It is 75% effective when practiced right. The man needs to also understand this method of birth control and the woman needs to be aware of how her body functions during her menstrual cycle. NFP can also be used as a method of getting pregnant. HOW THE NFP METHOD WORKS  A woman's menstrual period usually happens every 28-30 days (it can vary from 23-35 days).  Ovulation happens 12-14 days before the start of the next menstrual period (the fertile period). The egg is fertile for 24 hours and the sperm can live for 3 days or more. If there is sexual intercourse at this time, pregnancy can occur. THERE ARE MANY TYPES OF NFP METHODS USED TO PREVENT PREGNANCY  The basal body temperature method. Often times, there is a slight increase of body temperature when a woman ovulates. Take your temperature every morning before getting out of bed. Write the temperature on a chart. An increase in the temperature shows ovulation has happened. Do not have sexual intercourse from the menstrual period up to three days after the increase in the temperature. Note that the body temperature may increase as a result of fever, restless sleep, and working schedules.  The ovulation cervical mucus method. During the menstrual cycle, the cervical mucus changes from dry and sticky to wet and slippery. Check the mucus of the vagina every day to look for these changes. Just before ovulation, the mucus becomes wet and slippery. On the last day of wetness, ovulation happens. To avoid getting pregnant, sexual intercourse is safe for about 10 days after the menstrual period and on the dry mucus days. Do not have sexual intercourse when  the mucus starts to show up and not until 4 days after the wet and slippery mucus goes away. Sexual intercourse after the 4 days have passed until the menstrual period starts is a safe time. Note that the mucus from the vagina can increase because of a vaginal or cervical infection, lubricants, some medicines, and sexual excitement.  The symptothermal method. This method uses both the temperature and the ovulation methods. Combine the two methods above to prevent pregnancy.  The calendar method. Record your menstrual periods and length of the cycles for 6 months. This is helpful when the menstrual cycle varies in the length of the cycle. The length of a menstrual cycle is from day 1 of the present menstrual period to day 1 of the next menstrual period. Then, find your fertile days of the month and do not have sexual intercourse during that time. You may need help from your health care provider to find out your fertile days. There are some signs of ovulation that may be helpful when trying to find the time of ovulation. This includes vaginal spotting or abdominal cramps during the middle of your menstrual cycle. Not all women have these symptoms. YOU SHOULD NOT USE NFP IF:  You have very irregular menstrual periods and may skip months.  You have abnormal bleeding.  You have a vaginal or cervical infection.  You are on medicines that can affect the vaginal mucus or body temperature. These medicines include antibiotics, thyroid medicines, and antihistamines (cold and allergy medicine). This information is not intended to replace advice given   to you by your health care provider. Make sure you discuss any questions you have with your health care provider. Document Released: 10/30/2007 Document Revised: 10/19/2015 Document Reviewed: 11/13/2012 Elsevier Interactive Patient Education  2017 Elsevier Inc.  

## 2017-08-20 ENCOUNTER — Ambulatory Visit (INDEPENDENT_AMBULATORY_CARE_PROVIDER_SITE_OTHER): Payer: Medicaid Other | Admitting: Medical

## 2017-08-20 ENCOUNTER — Encounter: Payer: Self-pay | Admitting: Medical

## 2017-08-20 VITALS — BP 110/72 | HR 78 | Wt 189.3 lb

## 2017-08-20 DIAGNOSIS — O9989 Other specified diseases and conditions complicating pregnancy, childbirth and the puerperium: Secondary | ICD-10-CM

## 2017-08-20 DIAGNOSIS — Z283 Underimmunization status: Secondary | ICD-10-CM

## 2017-08-20 DIAGNOSIS — Z3400 Encounter for supervision of normal first pregnancy, unspecified trimester: Secondary | ICD-10-CM

## 2017-08-20 DIAGNOSIS — O09899 Supervision of other high risk pregnancies, unspecified trimester: Secondary | ICD-10-CM

## 2017-08-20 NOTE — Progress Notes (Signed)
   PRENATAL VISIT NOTE  Subjective:  Adrienne Craig is a 22 y.o. Adrienne KocherG1P0 at 5184w0d being seen today for ongoing prenatal care.  She is currently monitored for the following issues for this low-risk pregnancy and has Supervision of normal first pregnancy, antepartum and Rubella non-immune status, antepartum on their problem list.  Patient reports no complaints.  Contractions: Irritability. Vag. Bleeding: None.  Movement: Present. Denies leaking of fluid.   The following portions of the patient's history were reviewed and updated as appropriate: allergies, current medications, past family history, past medical history, past social history, past surgical history and problem list. Problem list updated.  Objective:   Vitals:   08/20/17 1130  BP: 110/72  Pulse: 78  Weight: 189 lb 4.8 oz (85.9 kg)    Fetal Status: Fetal Heart Rate (bpm): 146 Fundal Height: 34 cm Movement: Present     General:  Alert, oriented and cooperative. Patient is in no acute distress.  Skin: Skin is warm and dry. No rash noted.   Cardiovascular: Normal heart rate noted  Respiratory: Normal respiratory effort, no problems with respiration noted  Abdomen: Soft, gravid, appropriate for gestational age.  Pain/Pressure: Present     Pelvic: Cervical exam deferred        Extremities: Normal range of motion.  Edema: Trace  Mental Status:  Normal mood and affect. Normal behavior. Normal judgment and thought content.   Assessment and Plan:  Pregnancy: G1P0 at 3384w0d  1. Supervision of normal first pregnancy, antepartum - GBS and GC/Chlmaydia at NV, patient aware   2. Rubella non-immune status, antepartum - vaccine PP  Preterm labor symptoms and general obstetric precautions including but not limited to vaginal bleeding, contractions, leaking of fluid and fetal movement were reviewed in detail with the patient. Please refer to After Visit Summary for other counseling recommendations.  Return in about 2 weeks (around  09/03/2017) for LOB.   Vonzella NippleJulie Lizzie Cokley, PA-C

## 2017-08-20 NOTE — Progress Notes (Signed)
Pt states a lot of discomfort daily

## 2017-08-20 NOTE — Patient Instructions (Signed)

## 2017-08-22 ENCOUNTER — Inpatient Hospital Stay (HOSPITAL_COMMUNITY)
Admission: AD | Admit: 2017-08-22 | Discharge: 2017-08-22 | Disposition: A | Discharge status: Home / Self Care | Payer: Medicaid Other | Source: Ambulatory Visit | Attending: Obstetrics and Gynecology | Admitting: Obstetrics and Gynecology

## 2017-08-22 ENCOUNTER — Encounter (HOSPITAL_COMMUNITY): Payer: Self-pay

## 2017-08-22 ENCOUNTER — Other Ambulatory Visit: Payer: Self-pay

## 2017-08-22 DIAGNOSIS — O26893 Other specified pregnancy related conditions, third trimester: Secondary | ICD-10-CM

## 2017-08-22 DIAGNOSIS — N898 Other specified noninflammatory disorders of vagina: Secondary | ICD-10-CM

## 2017-08-22 DIAGNOSIS — Z3689 Encounter for other specified antenatal screening: Secondary | ICD-10-CM

## 2017-08-22 DIAGNOSIS — Z3A34 34 weeks gestation of pregnancy: Secondary | ICD-10-CM | POA: Diagnosis not present

## 2017-08-22 DIAGNOSIS — Z7982 Long term (current) use of aspirin: Secondary | ICD-10-CM | POA: Diagnosis not present

## 2017-08-22 LAB — AMNISURE RUPTURE OF MEMBRANE (ROM) NOT AT ARMC: Amnisure ROM: NEGATIVE

## 2017-08-22 NOTE — MAU Note (Signed)
Urine sent to lab 

## 2017-08-22 NOTE — MAU Provider Note (Signed)
History     CSN: [id number]  Arrival date and time: 08/22/17 1821   First Provider Initiated Contact with Patient 08/22/17 1926      Chief Complaint  Patient presents with  . Rupture of Membranes   G1 @34 .2 wks here with leaking fluid. Reports happened around 9:30 am today. Describes as gush and was colorless and odorless. Underwear had been wet since but no more gush or leaking. Had IC this am prior to LOF. No VB. No ctx. Good FM.    OB History    Gravida  1   Para      Term      Preterm      AB      Living        SAB      TAB      Ectopic      Multiple      Live Births              Past Medical History:  Diagnosis Date  . Medical history non-contributory     Past Surgical History:  Procedure Laterality Date  . NO PAST SURGERIES      Family History  Problem Relation Age of Onset  . Diabetes Mother   . Arthritis Mother     Social History   Tobacco Use  . Smoking status: Never Smoker  . Smokeless tobacco: Never Used  Substance Use Topics  . Alcohol use: No  . Drug use: No    Allergies:  Allergies  Allergen Reactions  . Mango Flavor [Flavoring Agent] Rash    Medications Prior to Admission  Medication Sig Dispense Refill Last Dose  . aspirin EC 81 MG tablet Take 1 tablet (81 mg total) daily by mouth. 30 tablet 10 Taking  . Prenatal MV-Min-Fe Fum-FA-DHA (PRENATAL 1 PO) Take by mouth.   Taking  . Prenatal Vit-Fe Fumarate-FA (MULTIVITAMIN-PRENATAL) 27-0.8 MG TABS tablet Take 1 tablet daily at 12 noon by mouth. 30 each 0 Taking  . ranitidine (ZANTAC) 150 MG tablet Take 1 tablet (150 mg total) by mouth 2 (two) times daily. 60 tablet 2 Taking    Review of Systems  Gastrointestinal: Negative for abdominal pain.  Genitourinary: Positive for vaginal discharge. Negative for vaginal bleeding.   Physical Exam   Blood pressure 109/72, pulse 94, temperature 98.1 F (36.7 C), temperature source Oral, resp. rate 19, height 5\' 2"  (1.575 m),  weight 193 lb (87.5 kg), last menstrual period 12/25/2016, SpO2 99 %.  Physical Exam  Constitutional: She is oriented to person, place, and time. She appears well-developed and well-nourished. No distress.  HENT:  Head: Normocephalic and atraumatic.  Neck: Normal range of motion.  Respiratory: Effort normal. No respiratory distress.  GI: Soft. She exhibits no distension. There is no tenderness.  Genitourinary:  Genitourinary Comments: SSE: +mucous discharge, no pool, fern neg, +sperm SVE: closd/thick  Musculoskeletal: Normal range of motion.  Neurological: She is alert and oriented to person, place, and time.  Skin: Skin is warm and dry.  Psychiatric: She has a normal mood and affect.  EFM: 135 bpm, mod variability, + accels, no decels Toco: irregular  Results for orders placed or performed during the hospital encounter of 08/22/17 (from the past 24 hour(s))  [facility]     Status: None   Collection Time: 08/22/17  7:27 PM  Result Value Ref Range   Amnisure ROM NEGATIVE    MAU Course  Procedures  MDM Labs  ordered and reviewed. No evidence of SROM or PTL. Stable for discharge home.  Assessment and Plan   1. [redacted] weeks gestation of pregnancy   2. NST (non-stress test) reactive   3. Vaginal discharge during pregnancy in third trimester    Discharge home Follow up in OB office as scheduled PTL precautions  Allergies as of 08/22/2017      Reactions   Mango Flavor [flavoring Agent] Rash      Medication List    STOP taking these medications   multivitamin-prenatal 27-0.8 MG Tabs tablet     TAKE these medications   aspirin EC 81 MG tablet Take 1 tablet (81 mg total) daily by mouth.   PRENATAL 1 PO Take by mouth.   ranitidine 150 MG tablet Commonly known as:  ZANTAC Take 1 tablet (150 mg total) by mouth 2 (two) times daily.      Donette Larry, CNM 08/22/2017, 8:01 PM

## 2017-08-22 NOTE — MAU Note (Signed)
Pt states she has been having "Braxton Hicks" UCs for a period of time (She has not been keeping up with it).  She presented for routine OV on Weds 3/27 & informed MD & was told that the feeling was normal, no SVE done at that time.  Pt states she felt some LOF yesterday (unsure of time) & states that her underwear was wet.  Pt states she had intercourse this am & noticed a gush of fluid approx 1.5hr (approx 0930) & states her underwear has been damp today (has not put on a pad).  Denies vag bleeding.  Pt states she fell on Mar 17, but was not seen by MD - fell on bottom b/c chair was moved.  Has not noticed pain since. Endorses +FM.

## 2017-08-22 NOTE — Discharge Instructions (Signed)
Braxton Hicks Contractions °Contractions of the uterus can occur throughout pregnancy, but they are not always a sign that you are in labor. You may have practice contractions called Braxton Hicks contractions. These false labor contractions are sometimes confused with true labor. °What are Braxton Hicks contractions? °Braxton Hicks contractions are tightening movements that occur in the muscles of the uterus before labor. Unlike true labor contractions, these contractions do not result in opening (dilation) and thinning of the cervix. Toward the end of pregnancy (32-34 weeks), Braxton Hicks contractions can happen more often and may become stronger. These contractions are sometimes difficult to tell apart from true labor because they can be very uncomfortable. You should not feel embarrassed if you go to the hospital with false labor. °Sometimes, the only way to tell if you are in true labor is for your health care provider to look for changes in the cervix. The health care provider will do a physical exam and may monitor your contractions. If you are not in true labor, the exam should show that your cervix is not dilating and your water has not broken. °If there are other health problems associated with your pregnancy, it is completely safe for you to be sent home with false labor. You may continue to have Braxton Hicks contractions until you go into true labor. °How to tell the difference between true labor and false labor °True labor °· Contractions last 30-70 seconds. °· Contractions become very regular. °· Discomfort is usually felt in the top of the uterus, and it spreads to the lower abdomen and low back. °· Contractions do not go away with walking. °· Contractions usually become more intense and increase in frequency. °· The cervix dilates and gets thinner. °False labor °· Contractions are usually shorter and not as strong as true labor contractions. °· Contractions are usually irregular. °· Contractions  are often felt in the front of the lower abdomen and in the groin. °· Contractions may go away when you walk around or change positions while lying down. °· Contractions get weaker and are shorter-lasting as time goes on. °· The cervix usually does not dilate or become thin. °Follow these instructions at home: °· Take over-the-counter and prescription medicines only as told by your health care provider. °· Keep up with your usual exercises and follow other instructions from your health care provider. °· Eat and drink lightly if you think you are going into labor. °· If Braxton Hicks contractions are making you uncomfortable: °? Change your position from lying down or resting to walking, or change from walking to resting. °? Sit and rest in a tub of warm water. °? Drink enough fluid to keep your urine pale yellow. Dehydration may cause these contractions. °? Do slow and deep breathing several times an hour. °· Keep all follow-up prenatal visits as told by your health care provider. This is important. °Contact a health care provider if: °· You have a fever. °· You have continuous pain in your abdomen. °Get help right away if: °· Your contractions become stronger, more regular, and closer together. °· You have fluid leaking or gushing from your vagina. °· You pass blood-tinged mucus (bloody show). °· You have bleeding from your vagina. °· You have low back pain that you never had before. °· You feel your baby’s head pushing down and causing pelvic pressure. °· Your baby is not moving inside you as much as it used to. °Summary °· Contractions that occur before labor are called Braxton   Hicks contractions, false labor, or practice contractions. °· Braxton Hicks contractions are usually shorter, weaker, farther apart, and less regular than true labor contractions. True labor contractions usually become progressively stronger and regular and they become more frequent. °· Manage discomfort from Braxton Hicks contractions by  changing position, resting in a warm bath, drinking plenty of water, or practicing deep breathing. °This information is not intended to replace advice given to you by your health care provider. Make sure you discuss any questions you have with your health care provider. °Document Released: 09/26/2016 Document Revised: 09/26/2016 Document Reviewed: 09/26/2016 °Elsevier Interactive Patient Education © 2018 [employer] ° °

## 2017-08-29 ENCOUNTER — Inpatient Hospital Stay (HOSPITAL_COMMUNITY)
Admission: AD | Admit: 2017-08-29 | Discharge: 2017-08-29 | Disposition: A | Discharge status: Home / Self Care | Payer: Medicaid Other | Source: Ambulatory Visit | Attending: Obstetrics and Gynecology | Admitting: Obstetrics and Gynecology

## 2017-08-29 ENCOUNTER — Encounter (HOSPITAL_COMMUNITY): Payer: Self-pay

## 2017-08-29 ENCOUNTER — Other Ambulatory Visit: Payer: Self-pay

## 2017-08-29 DIAGNOSIS — Z7982 Long term (current) use of aspirin: Secondary | ICD-10-CM | POA: Insufficient documentation

## 2017-08-29 DIAGNOSIS — O26893 Other specified pregnancy related conditions, third trimester: Secondary | ICD-10-CM | POA: Insufficient documentation

## 2017-08-29 DIAGNOSIS — IMO0001 Reserved for inherently not codable concepts without codable children: Secondary | ICD-10-CM

## 2017-08-29 DIAGNOSIS — O9989 Other specified diseases and conditions complicating pregnancy, childbirth and the puerperium: Secondary | ICD-10-CM | POA: Diagnosis not present

## 2017-08-29 DIAGNOSIS — Z3A35 35 weeks gestation of pregnancy: Secondary | ICD-10-CM | POA: Diagnosis not present

## 2017-08-29 DIAGNOSIS — T6291XA Toxic effect of unspecified noxious substance eaten as food, accidental (unintentional), initial encounter: Secondary | ICD-10-CM

## 2017-08-29 DIAGNOSIS — R197 Diarrhea, unspecified: Secondary | ICD-10-CM | POA: Diagnosis present

## 2017-08-29 LAB — URINALYSIS, ROUTINE W REFLEX MICROSCOPIC
Bilirubin Urine: NEGATIVE
Glucose, UA: NEGATIVE mg/dL
Hgb urine dipstick: NEGATIVE
Ketones, ur: 20 mg/dL — AB
Nitrite: NEGATIVE
PH: 6 (ref 5.0–8.0)
Protein, ur: 30 mg/dL — AB
SPECIFIC GRAVITY, URINE: 1.021 (ref 1.005–1.030)

## 2017-08-29 LAB — COMPREHENSIVE METABOLIC PANEL
ALBUMIN: 3.2 g/dL — AB (ref 3.5–5.0)
ALK PHOS: 123 U/L (ref 38–126)
ALT: 13 U/L — ABNORMAL LOW (ref 14–54)
AST: 17 U/L (ref 15–41)
Anion gap: 9 (ref 5–15)
BILIRUBIN TOTAL: 0.7 mg/dL (ref 0.3–1.2)
BUN: 8 mg/dL (ref 6–20)
CALCIUM: 8.6 mg/dL — AB (ref 8.9–10.3)
CO2: 17 mmol/L — ABNORMAL LOW (ref 22–32)
Chloride: 106 mmol/L (ref 101–111)
Creatinine, Ser: 0.38 mg/dL — ABNORMAL LOW (ref 0.44–1.00)
GFR calc Af Amer: >60 mL/min (ref >60)
GFR calc non Af Amer: >60 mL/min (ref >60)
GLUCOSE: 78 mg/dL (ref 65–99)
POTASSIUM: 3.6 mmol/L (ref 3.5–5.1)
Sodium: 132 mmol/L — ABNORMAL LOW (ref 135–145)
TOTAL PROTEIN: 7.1 g/dL (ref 6.5–8.1)

## 2017-08-29 MED ORDER — FAMOTIDINE IN NACL 20-0.9 MG/50ML-% IV SOLN
20.0000 mg | Freq: Once | INTRAVENOUS | Status: AC
  Administered 2017-08-29: 20 mg via INTRAVENOUS
  Filled 2017-08-29: qty 50

## 2017-08-29 MED ORDER — PROMETHAZINE HCL 12.5 MG PO TABS: 12.5000 mg | ORAL_TABLET | Freq: Four times a day (QID) | ORAL | 0 refills | Status: DC | PRN

## 2017-08-29 MED ORDER — LACTATED RINGERS IV BOLUS: 1000.0000 mL | Freq: Once | INTRAVENOUS | Status: DC

## 2017-08-29 MED ORDER — ONDANSETRON 4 MG PO TBDP: 4.0000 mg | ORAL_TABLET | Freq: Three times a day (TID) | ORAL | 0 refills | Status: DC | PRN

## 2017-08-29 MED ORDER — DEXTROSE 5 % IN LACTATED RINGERS IV BOLUS
1000.0000 mL | Freq: Once | INTRAVENOUS | Status: AC
  Administered 2017-08-29: 1000 mL via INTRAVENOUS

## 2017-08-29 MED ORDER — ONDANSETRON HCL 40 MG/20ML IJ SOLN
8.0000 mg | Freq: Once | INTRAMUSCULAR | Status: AC
  Administered 2017-08-29: 8 mg via INTRAVENOUS
  Filled 2017-08-29: qty 4

## 2017-08-29 NOTE — MAU Provider Note (Signed)
Chief Complaint: Diarrhea   First Provider Initiated Contact with Patient 08/29/17 1432      SUBJECTIVE HPI: Adrienne Craig is a 22 y.o. G1P0 at 443w2d by LMP who presents to maternity admissions reporting abdominal pain since last night and nausea/vomiting/diarrhea since 0900 this morning. Patient states at about 2300 yesterday she began having abdominal "twisting" then this morning suddenly began having nausea, vomiting, and diarrhea. Patient estimates she has had 15 episodes of vomiting and is unable to estimate episodes of diarrhea. She noticed a pink color briefly to her emesis but emesis has recently been yellow (she tried eating fruit loops this morning). She also reports seeing some red color to her diarrhea recently, which she says is watery, as well as some rectal burning. She has been unable to tolerate PO intake since symptoms began. She endorses generalized weakness and dizziness with standing. On 08/27/2017, patient ate a meal at [employer] with her sister and child and they are both ill now with similar sxs (ate different meals).   She denies vaginal bleeding, vaginal itching/burning, urinary symptoms, h/a, or fever/chills.     HPI  Past Medical History:  Diagnosis Date  . Medical history non-contributory    Past Surgical History:  Procedure Laterality Date  . NO PAST SURGERIES     Social History   Socioeconomic History  . Marital status: Single    Spouse name: Not on file  . Number of children: Not on file  . Years of education: Not on file  . Highest education level: Not on file  Occupational History  . Not on file  Social Needs  . Financial resource strain: Not on file  . Food insecurity:    Worry: Not on file    Inability: Not on file  . Transportation needs:    Medical: Not on file    Non-medical: Not on file  Tobacco Use  . Smoking status: Never Smoker  . Smokeless tobacco: Never Used  Substance and Sexual Activity  . Alcohol use: No  . Drug use: No  .  Sexual activity: Yes    Birth control/protection: None  Lifestyle  . Physical activity:    Days per week: Not on file    Minutes per session: Not on file  . Stress: Not on file  Relationships  . Social connections:    Talks on phone: Not on file    Gets together: Not on file    Attends religious service: Not on file    Active member of club or organization: Not on file    Attends meetings of clubs or organizations: Not on file    Relationship status: Not on file  . Intimate partner violence:    Fear of current or ex partner: Not on file    Emotionally abused: Not on file    Physically abused: Not on file    Forced sexual activity: Not on file  Other Topics Concern  . Not on file  Social History Narrative  . Not on file   No current facility-administered medications on file prior to encounter.    Current Outpatient Medications on File Prior to Encounter  Medication Sig Dispense Refill  . aspirin EC 81 MG tablet Take 1 tablet (81 mg total) daily by mouth. 30 tablet 10  . Prenatal MV-Min-Fe Fum-FA-DHA (PRENATAL 1 PO) Take 1 tablet by mouth daily.     . ranitidine (ZANTAC) 150 MG tablet Take 1 tablet (150 mg total) by mouth 2 (two) times daily. (Patient  not taking: Reported on 08/29/2017) 60 tablet 2   Allergies  Allergen Reactions  . Mango Flavor [Flavoring Agent] Rash    ROS:  Review of Systems  Constitutional: Negative for chills and fever.  Gastrointestinal: Positive for abdominal pain, diarrhea, nausea, rectal pain and vomiting.  Genitourinary: Negative for dysuria, hematuria, vaginal bleeding and vaginal discharge.  Neurological: Positive for dizziness (with standing) and weakness (generalized).  All other systems reviewed and are negative.    I have reviewed patient's Past Medical Hx, Surgical Hx, Family Hx, Social Hx, medications and allergies.   Physical Exam   Patient Vitals for the past 24 hrs:  BP Temp Pulse Resp  08/29/17 1354 116/81 97.9 F (36.6 C) (!)  117 18   Constitutional: Well-developed, well-nourished female in no acute distress.  Cardiovascular: normal rate Respiratory: normal effort GI: Abd soft, tender to palpation in upper abdomen.  MS: Extremities nontender, no edema, normal ROM Neurologic: Alert and oriented x 4.  GU: Neg CVAT.  Fetal heart rate monitor: Baseline 120, moderate variability, +15x15 acels, no decels.  Toco: irritability   LAB RESULTS Results for orders placed or performed during the hospital encounter of 08/29/17 (from the past 24 hour(s))  Urinalysis, Routine w reflex microscopic     Status: Abnormal   Collection Time: 08/29/17  1:45 PM  Result Value Ref Range   Color, Urine YELLOW YELLOW   APPearance HAZY (A) CLEAR   Specific Gravity, Urine 1.021 1.005 - 1.030   pH 6.0 5.0 - 8.0   Glucose, UA NEGATIVE NEGATIVE mg/dL   Hgb urine dipstick NEGATIVE NEGATIVE   Bilirubin Urine NEGATIVE NEGATIVE   Ketones, ur 20 (A) NEGATIVE mg/dL   Protein, ur 30 (A) NEGATIVE mg/dL   Nitrite NEGATIVE NEGATIVE   Leukocytes, UA SMALL (A) NEGATIVE   RBC / HPF 0-5 0 - 5 RBC/hpf   WBC, UA 6-30 0 - 5 WBC/hpf   Bacteria, UA RARE (A) NONE SEEN   Squamous Epithelial / LPF 6-30 (A) NONE SEEN   Mucus PRESENT   Comprehensive metabolic panel     Status: Abnormal   Collection Time: 08/29/17  2:45 PM  Result Value Ref Range   Sodium 132 (L) 135 - 145 mmol/L   Potassium 3.6 3.5 - 5.1 mmol/L   Chloride 106 101 - 111 mmol/L   CO2 17 (L) 22 - 32 mmol/L   Glucose, Bld 78 65 - 99 mg/dL   BUN 8 6 - 20 mg/dL   Creatinine, Ser 1.61 (L) 0.44 - 1.00 mg/dL   Calcium 8.6 (L) 8.9 - 10.3 mg/dL   Total Protein 7.1 6.5 - 8.1 g/dL   Albumin 3.2 (L) 3.5 - 5.0 g/dL   AST 17 15 - 41 U/L   ALT 13 (L) 14 - 54 U/L   Alkaline Phosphatase 123 38 - 126 U/L   Total Bilirubin 0.7 0.3 - 1.2 mg/dL   GFR calc non Af Amer >60 >60 mL/min   GFR calc Af Amer >60 >60 mL/min   Anion gap 9 5 - 15    A/Positive/-- (11/05 1511)  IMAGING No results  found.  MAU Management/MDM: Orders Placed This Encounter  Procedures  . Urinalysis, Routine w reflex microscopic  . Comprehensive metabolic panel  . Contact Isolation: Enteric  . Discharge patient Discharge disposition: 01-Home or Self Care; Discharge patient date: 08/29/2017  . Discharge patient Discharge disposition: 01-Home or Self Care; Discharge patient date: 08/29/2017    Meds ordered this encounter  Medications  .  dextrose 5% lactated ringers bolus 1,000 mL  . famotidine (PEPCID) IVPB 20 mg premix  . ondansetron (ZOFRAN) 8 mg in sodium chloride 0.9 % 50 mL IVPB  . lactated ringers bolus 1,000 mL  . ondansetron (ZOFRAN ODT) 4 MG disintegrating tablet    Sig: Take 1 tablet (4 mg total) by mouth every 8 (eight) hours as needed for nausea or vomiting.    Dispense:  20 tablet    Refill:  0    Order Specific Question:   Supervising Provider    Answer:   ERVIN, MICHAEL L [[id number]]  . promethazine (PHENERGAN) 12.5 MG tablet    Sig: Take 1 tablet (12.5 mg total) by mouth every 6 (six) hours as needed for nausea or vomiting.    Dispense:  30 tablet    Refill:  0    Order Specific Question:   Supervising Provider    Answer:   ERVIN, MICHAEL L [[id number]]    UA, CMP to assess for electrolyte disturbance and dehydration. 1L detrose 5% LR, Pepcid and Zofran for symptomatic relief.  Patient feeling better and tolerating PO fluids.   ASSESSMENT 1. Food poisoning, accidental or unintentional, initial encounter     PLAN Discharge home BRAT diet gradually advance as tolerated  Home rx for Phenergan and Zofran  Return precautions given   Follow-up Information    [facility] [place] [facility]ERN[facility] ADMISSIONS Follow up.   Why:  as needed, if symptoms worsen  Contact information: [address] [id number] mc [place] [address] [phone]             Felicie Morn, Medical Student 08/29/2017  5:08 PM

## 2017-08-29 NOTE — Discharge Instructions (Signed)
Food Poisoning Food poisoning is an illness that is caused by eating or drinking contaminated foods or drinks. Food poisoning is usually mild and lasts 1-2 days. Foods and drinks can become contaminated because of:  Poor personal hygiene, such as not washing hands well enough or often.  Not storing food properly. For example, not refrigerating raw meat.  Serving, preparing, and storing food on surfaces that are not clean.  Cooking or eating with utensils that are not clean.  The most common causes of this condition are:  Viruses.  Bacteria.  Parasites.  Follow these instructions at home: Eating and drinking   Drink enough fluids to keep your pee (urine) clear or pale yellow. You may need to drink small amounts of clear liquids often.  Avoid: ? Milk. ? Caffeine. ? Alcohol.  Ask your doctor exactly how you should get enough fluid in your body (rehydrate).  Eat small meals often rather than eating large meals. Medicines  Take over-the-counter and prescription medicines only as told by your doctor. Ask your doctor if you should continue to take any of your regular prescribed and over-the-counter medicines.  If you were prescribed an antibiotic medicine, take it as told by your doctor. Do not stop taking the antibiotic even if you start to feel better. General instructions   Wash your hands fully before you prepare food and after you go to the bathroom (use the toilet). Make sure people who live with you also wash their hands often.  Clean surfaces that you touch with a product that contains chlorine bleach.  Keep all follow-up visits as told by your doctor. This is important. How is this prevented?  Wash your hands, food preparation surfaces, and utensils before and after you handle raw foods.  Use separate food preparation surfaces and storage spaces for raw meat and for fruits and vegetables.  Keep refrigerated foods colder than 40F (5C).  Serve hot foods right  away or keep them heated above 140F (60C).  Store dry foods in cool, dry spaces. Keep them away from too much heat or moisture.  Throw out any foods that: ? Do not smell right. ? Are in cans that are bulging.  Follow approved canning procedures.  Heat canned foods fully before you taste them.  Drink bottled or sterile water when you travel. Get help right away if: Call 911 or go to the emergency room if:  You have trouble: ? Breathing. ? Swallowing. ? Talking. ? Moving.  You have blurry vision.  You cannot eat or drink without throwing up (vomiting).  You pass out (faint).  Your eyes turn yellow.  You continue to throw up or have watery poop (diarrhea).  You start to have pain in your belly (abdomen), your belly pain gets worse, or your belly pain stays in one small area.  You have a fever.  You have blood or mucus in your poop (stools), or your poop looks dark black and tarry.  You have signs of dehydration, such as: ? Dark pee, very little pee, or no pee. ? Cracked lips. ? Not making tears while crying. ? Dry mouth. ? Sunken eyes. ? Sleepiness. ? Weakness. ? Dizziness.  This information is not intended to replace advice given to you by your health care provider. Make sure you discuss any questions you have with your health care provider. Document Released: 10/31/2009 Document Revised: 10/19/2015 Document Reviewed: 11/14/2014 Elsevier Interactive Patient Education  2018 [employer]  

## 2017-08-29 NOTE — MAU Provider Note (Signed)
History     CSN: [id number]  Arrival date and time: 08/29/17 1336   First Provider Initiated Contact with Patient 08/29/17 1432      Chief Complaint  Patient presents with  . Diarrhea   HPI   [name] is a 22 y.o. female G1P0 @  65w2dhere in MAU with complaints of vomiting and diarrhea. Says she ate at [employer] last night and symptoms started shortly after eating there. Her other family members who ate there also have symptoms. She started having diarrhea, and then the vomiting started this morning. She has had more episodes than she can count. No vaginal bleeding. + fetal movement. No pain.   OB History    Gravida  1   Para      Term      Preterm      AB      Living        SAB      TAB      Ectopic      Multiple      Live Births              Past Medical History:  Diagnosis Date  . Medical history non-contributory     Past Surgical History:  Procedure Laterality Date  . NO PAST SURGERIES      Family History  Problem Relation Age of Onset  . Diabetes Mother   . Arthritis Mother     Social History   Tobacco Use  . Smoking status: Never Smoker  . Smokeless tobacco: Never Used  Substance Use Topics  . Alcohol use: No  . Drug use: No    Allergies:  Allergies  Allergen Reactions  . Mango Flavor [Flavoring Agent] Rash    Medications Prior to Admission  Medication Sig Dispense Refill Last Dose  . aspirin EC 81 MG tablet Take 1 tablet (81 mg total) daily by mouth. 30 tablet 10 Past Month at Unknown time  . Prenatal MV-Min-Fe Fum-FA-DHA (PRENATAL 1 PO) Take 1 tablet by mouth daily.    08/26/2017  . ranitidine (ZANTAC) 150 MG tablet Take 1 tablet (150 mg total) by mouth 2 (two) times daily. (Patient not taking: Reported on 08/29/2017) 60 tablet 2 Not Taking at Unknown time   Results for orders placed or performed during the hospital encounter of 08/29/17 (from the past 48 hour(s))  Urinalysis, Routine w reflex microscopic      Status: Abnormal   Collection Time: 08/29/17  1:45 PM  Result Value Ref Range   Color, Urine YELLOW YELLOW   APPearance HAZY (A) CLEAR   Specific Gravity, Urine 1.021 1.005 - 1.030   pH 6.0 5.0 - 8.0   Glucose, UA NEGATIVE NEGATIVE mg/dL   Hgb urine dipstick NEGATIVE NEGATIVE   Bilirubin Urine NEGATIVE NEGATIVE   Ketones, ur 20 (A) NEGATIVE mg/dL   Protein, ur 30 (A) NEGATIVE mg/dL   Nitrite NEGATIVE NEGATIVE   Leukocytes, UA SMALL (A) NEGATIVE   RBC / HPF 0-5 0 - 5 RBC/hpf   WBC, UA 6-30 0 - 5 WBC/hpf   Bacteria, UA RARE (A) NONE SEEN   Squamous Epithelial / LPF 6-30 (A) NONE SEEN   Mucus PRESENT     Comment: Performed at [facility] [address] [place] [id number] Comprehensive metabolic panel     Status: Abnormal   Collection Time: 08/29/17  2:45 PM  Result Value Ref Range   Sodium 132 (L) 135 - 145 mmol/L  Potassium 3.6 3.5 - 5.1 mmol/L   Chloride 106 101 - 111 mmol/L   CO2 17 (L) 22 - 32 mmol/L   Glucose, Bld 78 65 - 99 mg/dL   BUN 8 6 - 20 mg/dL   Creatinine, Ser 0.38 (L) 0.44 - 1.00 mg/dL   Calcium 8.6 (L) 8.9 - 10.3 mg/dL   Total Protein 7.1 6.5 - 8.1 g/dL   Albumin 3.2 (L) 3.5 - 5.0 g/dL   AST 17 15 - 41 U/L   ALT 13 (L) 14 - 54 U/L   Alkaline Phosphatase 123 38 - 126 U/L   Total Bilirubin 0.7 0.3 - 1.2 mg/dL   GFR calc non Af Amer >60 >60 mL/min   GFR calc Af Amer >60 >60 mL/min    Comment: (NOTE) The eGFR has been calculated using the CKD EPI equation. This calculation has not been validated in all clinical situations. eGFR's persistently <60 mL/min signify possible Chronic Kidney Disease.    Anion gap 9 5 - 15    Comment: Performed at [facility], [address] [place], [place] [address]   Review of Systems  Constitutional: Negative for fever.  Gastrointestinal: Positive for diarrhea, nausea and vomiting.  Genitourinary: Negative for pelvic pain and vaginal bleeding.   Physical Exam   Blood pressure 116/81, pulse (!) 117,  temperature 97.9 F (36.6 C), resp. rate 18, last menstrual period 12/25/2016.  Physical Exam  Constitutional: She is oriented to person, place, and time. She appears well-developed and well-nourished. No distress.  HENT:  Head: Normocephalic.  Eyes: Pupils are equal, round, and reactive to light.  Respiratory: Effort normal.  GI: Soft.  Musculoskeletal: Normal range of motion.  Neurological: She is alert and oriented to person, place, and time.  Skin: Skin is warm. She is not diaphoretic.  Psychiatric: Her behavior is normal.   Fetal Tracing: Baseline: 125 bpm Variability: Moderate  Accelerations: 15x15 Decelerations: None Toco: Quiet   MAU Course  Procedures  None  MDM  LR bolus X1 D5LR X 1 Pepcid 20 mg X1 Zofran 8 mg IV Patient feeling better, tolerating PO fluids  Assessment and Plan   A:  1. Food poisoning, accidental or unintentional, initial encounter     P:  Discharge home in stable condition BRAT diet Rx: Zofran, Phenergan Return to MAU if symptoms worsen Increase PO fluids  Damian Hofstra, Artist Pais, NP 08/29/2017 5:03 PM

## 2017-08-29 NOTE — MAU Note (Addendum)
Patient presents with vomiting and diarrhea since this morning.  Too many episode of diarrhea to count.   Patient thought it was food poisoning because they ate at [employer] and now her sister in law and her child are sick.  They ate different things from the menu.

## 2017-09-03 ENCOUNTER — Ambulatory Visit (INDEPENDENT_AMBULATORY_CARE_PROVIDER_SITE_OTHER): Payer: Medicaid Other | Admitting: Advanced Practice Midwife

## 2017-09-03 ENCOUNTER — Other Ambulatory Visit (HOSPITAL_COMMUNITY)
Admission: RE | Admit: 2017-09-03 | Discharge: 2017-09-03 | Disposition: A | Discharge status: Home / Self Care | Payer: Medicaid Other | Source: Ambulatory Visit | Attending: Advanced Practice Midwife | Admitting: Advanced Practice Midwife

## 2017-09-03 VITALS — BP 121/71 | HR 82 | Wt 193.9 lb

## 2017-09-03 DIAGNOSIS — Z3400 Encounter for supervision of normal first pregnancy, unspecified trimester: Secondary | ICD-10-CM

## 2017-09-03 DIAGNOSIS — Z3403 Encounter for supervision of normal first pregnancy, third trimester: Secondary | ICD-10-CM

## 2017-09-03 NOTE — Progress Notes (Incomplete)
   PRENATAL VISIT NOTE  Subjective:  Adrienne Craig is a 22 y.o. G1P0 at 9478w0d being seen today for ongoing prenatal care.  She is currently monitored for the following issues for this {Blank single:19197::"high-risk","low-risk"} pregnancy and has Supervision of normal first pregnancy, antepartum and Rubella non-immune status, antepartum on their problem list.  Patient reports {sx:14538}.  Contractions: Irregular. Vag. Bleeding: None.  Movement: Present. Denies leaking of fluid.   The following portions of the patient's history were reviewed and updated as appropriate: allergies, current medications, past family history, past medical history, past social history, past surgical history and problem list. Problem list updated.  Objective:   Vitals:   09/03/17 1209  BP: 121/71  Pulse: 82  Weight: 193 lb 14.4 oz (88 kg)    Fetal Status: Fetal Heart Rate (bpm): 131 Fundal Height: 36 cm Movement: Present  Presentation: Vertex  General:  Alert, oriented and cooperative. Patient is in no acute distress.  Skin: Skin is warm and dry. No rash noted.   Cardiovascular: Normal heart rate noted  Respiratory: Normal respiratory effort, no problems with respiration noted  Abdomen: Soft, gravid, appropriate for gestational age.  Pain/Pressure: Present     Pelvic: {Blank single:19197::"Cervical exam performed","Cervical exam deferred"} Dilation: 1 Effacement (%): 0 Station: Ballotable  Extremities: Normal range of motion.  Edema: Trace  Mental Status: Normal mood and affect. Normal behavior. Normal judgment and thought content.   Assessment and Plan:  Pregnancy: G1P0 at 2378w0d  1. Supervision of normal first pregnancy, antepartum *** - Culture, beta strep (group b only) - [facility]lam[facility]  {Blank single:19197::"Term","Preterm"} labor symptoms and general obstetric precautions including but not limited to vaginal bleeding, contractions, leaking of fluid and fetal  movement were reviewed in detail with the patient. Please refer to After Visit Summary for other counseling recommendations.  Return in about 1 week (around 09/10/2017) for ROB.  Future Appointments  Date Time Provider Department Center  09/10/2017 11:15 AM Kathlene CoteWenzel, Julie N, PA[facility]C [facility]  09/17/2017 11:15 AM Judeth HornLawrence, Erin, NP [facility]    Dorathy Kinsman[place]ia Prachi Oftedahl, [place]

## 2017-09-03 NOTE — Progress Notes (Signed)
   PRENATAL VISIT NOTE  Subjective:  Adrienne Craig is a 22 y.o. G1P0 at 5541w0d being seen today for ongoing prenatal care.  She is currently monitored for the following issues for this low-risk pregnancy and has Supervision of normal first pregnancy, antepartum and Rubella non-immune status, antepartum on their problem list.  Patient reports occasional contractions.  Contractions: Irregular. Vag. Bleeding: None.  Movement: Present. Denies leaking of fluid.   The following portions of the patient's history were reviewed and updated as appropriate: allergies, current medications, past family history, past medical history, past social history, past surgical history and problem list. Problem list updated.  Objective:   Vitals:   09/03/17 1209  BP: 121/71  Pulse: 82  Weight: 193 lb 14.4 oz (88 kg)    Fetal Status: Fetal Heart Rate (bpm): 131 Fundal Height: 36 cm Movement: Present  Presentation: Vertex  General:  Alert, oriented and cooperative. Patient is in no acute distress.  Skin: Skin is warm and dry. No rash noted.   Cardiovascular: Normal heart rate noted  Respiratory: Normal respiratory effort, no problems with respiration noted  Abdomen: Soft, gravid, appropriate for gestational age.  Pain/Pressure: Present     Pelvic: Cervical exam performed Dilation: 1 Effacement (%): 0 Station: Ballotable  Extremities: Normal range of motion.  Edema: Trace  Mental Status: Normal mood and affect. Normal behavior. Normal judgment and thought content.   Assessment and Plan:  Pregnancy: G1P0 at 6441w0d  1. Supervision of normal first pregnancy, antepartum  - Culture, beta strep (group b only) - GC[facility]Chlamydia probe amp [facility]  Term labor symptoms and general obstetric precautions including but not limited to vaginal bleeding, contractions, leaking of fluid and fetal movement were reviewed in detail with the patient. Please refer to After Visit Summary for other counseling  recommendations.  Return in about 1 week (around 09/10/2017) for ROB.  Future Appointments  Date Time Provider Department Center  09/10/2017 11:15 AM Kathlene CoteWenzel, Julie N, PA-C [facility]  09/17/2017 11:15 AM Judeth HornLawrence, Erin, NP [facility]    Dorathy KinsmanVirginia Adonis Yim, [place]

## 2017-09-03 NOTE — Patient Instructions (Signed)

## 2017-09-04 LAB — GC/CHLAMYDIA PROBE AMP (~~LOC~~) NOT AT ARMC
Chlamydia: NEGATIVE
NEISSERIA GONORRHEA: NEGATIVE

## 2017-09-07 LAB — CULTURE, BETA STREP (GROUP B ONLY): Strep Gp B Culture: NEGATIVE

## 2017-09-10 ENCOUNTER — Encounter: Payer: Self-pay | Admitting: Medical

## 2017-09-10 ENCOUNTER — Ambulatory Visit (INDEPENDENT_AMBULATORY_CARE_PROVIDER_SITE_OTHER): Payer: Medicaid Other | Admitting: Medical

## 2017-09-10 VITALS — BP 123/73 | HR 90 | Wt 194.5 lb

## 2017-09-10 DIAGNOSIS — Z2839 Other underimmunization status: Secondary | ICD-10-CM

## 2017-09-10 DIAGNOSIS — O9989 Other specified diseases and conditions complicating pregnancy, childbirth and the puerperium: Secondary | ICD-10-CM

## 2017-09-10 DIAGNOSIS — Z283 Underimmunization status: Secondary | ICD-10-CM

## 2017-09-10 DIAGNOSIS — Z3403 Encounter for supervision of normal first pregnancy, third trimester: Secondary | ICD-10-CM

## 2017-09-10 DIAGNOSIS — Z3400 Encounter for supervision of normal first pregnancy, unspecified trimester: Secondary | ICD-10-CM

## 2017-09-10 NOTE — Progress Notes (Signed)
Patient and/or legal guardian verbally declined to  meet with Behavioral Health Clinician about elevated phq9 , gad 7. States depressed because boyfriends father died.

## 2017-09-10 NOTE — Patient Instructions (Signed)

## 2017-09-10 NOTE — Progress Notes (Signed)
   PRENATAL VISIT NOTE  Subjective:  Adrienne Craig is a 22 y.o. G1P0 at 466w0d being seen today for ongoing prenatal care.  She is currently monitored for the following issues for this low-risk pregnancy and has Supervision of normal first pregnancy, antepartum and Rubella non-immune status, antepartum on their problem list.  Patient reports no complaints.  Contractions: Irregular. Vag. Bleeding: None.  Movement: Present. Denies leaking of fluid.   The following portions of the patient's history were reviewed and updated as appropriate: allergies, current medications, past family history, past medical history, past social history, past surgical history and problem list. Problem list updated.  Objective:   Vitals:   09/10/17 1130  BP: 123/73  Pulse: 90  Weight: 194 lb 8 oz (88.2 kg)    Fetal Status: Fetal Heart Rate (bpm): 148 Fundal Height: 37 cm Movement: Present  Presentation: Vertex  General:  Alert, oriented and cooperative. Patient is in no acute distress.  Skin: Skin is warm and dry. No rash noted.   Cardiovascular: Normal heart rate noted  Respiratory: Normal respiratory effort, no problems with respiration noted  Abdomen: Soft, gravid, appropriate for gestational age.  Pain/Pressure: Present     Pelvic: Cervical exam performed Dilation: 1 Effacement (%): 0 Station: -3  Extremities: Normal range of motion.  Edema: Trace  Mental Status: Normal mood and affect. Normal behavior. Normal judgment and thought content.   Assessment and Plan:  Pregnancy: G1P0 at 2366w0d  1. Supervision of normal first pregnancy, antepartum - Doing well, very few mild contractions, mucous discharge last night  - GBS negative at last visit, discussed results with patient   2. Rubella non-immune status, antepartum - Immunize PP   Term labor symptoms and general obstetric precautions including but not limited to vaginal bleeding, contractions, leaking of fluid and fetal movement were reviewed in  detail with the patient. Please refer to After Visit Summary for other counseling recommendations.  Return in about 1 week (around 09/17/2017) for LOB.  Future Appointments  Date Time Provider Department Center  09/17/2017 11:15 AM Judeth HornLawrence, Erin, NP [facility]    Vonzella NippleJulie Wenzel, PA-C

## 2017-09-17 ENCOUNTER — Ambulatory Visit (INDEPENDENT_AMBULATORY_CARE_PROVIDER_SITE_OTHER): Payer: Medicaid Other | Admitting: Student

## 2017-09-17 VITALS — BP 117/78 | HR 81 | Wt 198.1 lb

## 2017-09-17 DIAGNOSIS — Z3400 Encounter for supervision of normal first pregnancy, unspecified trimester: Secondary | ICD-10-CM

## 2017-09-17 NOTE — Patient Instructions (Signed)
Before Baby Comes Home  Before your baby arrives it is important to:   Have all of the supplies that you will need to care for your baby.   Know where to go if there is an emergency.   Discuss the baby's arrival with other family members.    What supplies will I need?    It is recommended that you have the following supplies:  Large Items   Crib.   Crib mattress.   Rear-facing infant car seat. If possible, have a trained professional check to make sure that it is installed correctly.    Feeding   6-8 bottles that are 4-5 oz in size.   6-8 nipples.   Bottle brush.   Sterilizer, or a large pan or kettle with a lid.   A way to boil and cool water.   If you will be breastfeeding:  ? Breast pump.  ? Nipple cream.  ? Nursing bra.  ? Breast pads.  ? Breast shields.   If you will be formula feeding:  ? Formula.  ? Measuring cups.  ? Measuring spoons.    Bathing   Mild baby soap and baby shampoo.   Petroleum jelly.   Soft cloth towel and washcloth.   Hooded towel.   Cotton balls.   Bath basin.    Other Supplies   Rectal thermometer.   Bulb syringe.   Baby wipes or washcloths for diaper changes.   Diaper bag.   Changing pad.   Clothing, including one-piece outfits and pajamas.   Baby nail clippers.   Receiving blankets.   Mattress pad and sheets for the crib.   Night-light for the baby's room.   Baby monitor.   2 or 3 pacifiers.   Either 24-36 cloth diapers and waterproof diaper covers or a box of disposable diapers. You may need to use as many as 10-12 diapers per day.    How do I prepare for an emergency?  Prepare for an emergency by:   Knowing how to get to the nearest hospital.   Listing the phone numbers of your baby's health care providers near your home phone and in your cell phone.    How do I prepare my family?   Decide how to handle visitors.   If you have other children:  ? Talk with them about the baby coming home. Ask them how they feel about it.  ? Read a book together about  being a new big brother or sister.  ? Find ways to let them help you prepare for the new baby.  ? Have someone ready to care for them while you are in the hospital.  This information is not intended to replace advice given to you by your health care provider. Make sure you discuss any questions you have with your health care provider.  Document Released: 04/25/2008 Document Revised: 10/19/2015 Document Reviewed: 04/20/2014  Elsevier Interactive Patient Education  2018 [employer]

## 2017-09-17 NOTE — Progress Notes (Signed)
   PRENATAL VISIT NOTE  Subjective:  Adrienne Craig is a 22 y.o. G1P0 at 1298w0d being seen today for ongoing prenatal care.  She is currently monitored for the following issues for this low-risk pregnancy and has Supervision of normal first pregnancy, antepartum and Rubella non-immune status, antepartum on their problem list.  Patient reports no complaints.  Contractions: Irregular. Vag. Bleeding: None.  Movement: Present. Denies leaking of fluid.   The following portions of the patient's history were reviewed and updated as appropriate: allergies, current medications, past family history, past medical history, past social history, past surgical history and problem list. Problem list updated.  Objective:   Vitals:   09/17/17 1127  BP: 117/78  Pulse: 81  Weight: 198 lb 1.6 oz (89.9 kg)    Fetal Status: Fetal Heart Rate (bpm): 157 Fundal Height: 38 cm Movement: Present  Presentation: Vertex  General:  Alert, oriented and cooperative. Patient is in no acute distress.  Skin: Skin is warm and dry. No rash noted.   Cardiovascular: Normal heart rate noted  Respiratory: Normal respiratory effort, no problems with respiration noted  Abdomen: Soft, gravid, appropriate for gestational age.  Pain/Pressure: Present     Pelvic: Cervical exam performed Dilation: 1 Effacement (%): 50 Station: -3  Extremities: Normal range of motion.  Edema: Trace  Mental Status: Normal mood and affect. Normal behavior. Normal judgment and thought content.   Assessment and Plan:  Pregnancy: G1P0 at 2898w0d  1. Supervision of normal first pregnancy, antepartum -doing well  Term labor symptoms and general obstetric precautions including but not limited to vaginal bleeding, contractions, leaking of fluid and fetal movement were reviewed in detail with the patient. Please refer to After Visit Summary for other counseling recommendations.  Return in about 1 week (around 09/24/2017) for Routine OB.  Future  Appointments  Date Time Provider Department Center  09/25/2017 10:55 AM Judeth HornLawrence, Sachiko Methot, NP [facility]    Judeth HornErin Masha Orbach, NP

## 2017-09-25 ENCOUNTER — Ambulatory Visit (INDEPENDENT_AMBULATORY_CARE_PROVIDER_SITE_OTHER): Payer: Medicaid Other | Admitting: Student

## 2017-09-25 VITALS — BP 109/72 | HR 86 | Wt 203.5 lb

## 2017-09-25 DIAGNOSIS — Z3403 Encounter for supervision of normal first pregnancy, third trimester: Secondary | ICD-10-CM

## 2017-09-25 DIAGNOSIS — Z3400 Encounter for supervision of normal first pregnancy, unspecified trimester: Secondary | ICD-10-CM

## 2017-09-25 NOTE — Progress Notes (Signed)
   PRENATAL VISIT NOTE  Subjective:  Adrienne Craig is a 22 y.o. G1P0 at [redacted]w[redacted]d being seen today for ongoing prenatal care.  She is currently monitored for the following issues for this low-risk pregnancy and has Supervision of normal first pregnancy, antepartum and Rubella non-immune status, antepartum on their problem list.  Patient reports occasional contractions.  Contractions: Irregular. Vag. Bleeding: None.  Movement: Present. Denies leaking of fluid.   The following portions of the patient's history were reviewed and updated as appropriate: allergies, current medications, past family history, past medical history, past social history, past surgical history and problem list. Problem list updated.  Objective:   Vitals:   09/25/17 1114  BP: 109/72  Pulse: 86  Weight: 203 lb 8 oz (92.3 kg)    Fetal Status: Fetal Heart Rate (bpm): 150   Movement: Present  Presentation: Vertex  General:  Alert, oriented and cooperative. Patient is in no acute distress.  Skin: Skin is warm and dry. No rash noted.   Cardiovascular: Normal heart rate noted  Respiratory: Normal respiratory effort, no problems with respiration noted  Abdomen: Soft, gravid, appropriate for gestational age.  Pain/Pressure: Present     Pelvic: Cervical exam performed Dilation: 2.5 Effacement (%): 60 Station: -3  Extremities: Normal range of motion.  Edema: Trace  Mental Status: Normal mood and affect. Normal behavior. Normal judgment and thought content.   Assessment and Plan:  Pregnancy: G1P0 at [redacted]w[redacted]d  1. Supervision of normal first pregnancy, antepartum -reports occasional contractions -Request membrane sweep. Discussed risks, side effects, benefits.   Term labor symptoms and general obstetric precautions including but not limited to vaginal bleeding, contractions, leaking of fluid and fetal movement were reviewed in detail with the patient. Please refer to After Visit Summary for other counseling recommendations.    Return in about 1 week (around 10/02/2017) for Routine OB.  Future Appointments  Date Time Provider Department Center  10/02/2017  8:35 AM Marny Lowenstein, PA-C [facility]    Judeth Horn, NP

## 2017-09-25 NOTE — Patient Instructions (Signed)
Vaginal Delivery Vaginal delivery means that you will give birth by pushing your baby out of your birth canal (vagina). A team of health care providers will help you before, during, and after vaginal delivery. Birth experiences are unique for every woman and every pregnancy, and birth experiences vary depending on where you choose to give birth. What should I do to prepare for my baby's birth? Before your baby is born, it is important to talk with your health care provider about:  Your labor and delivery preferences. These may include: ? Medicines that you may be given. ? How you will manage your pain. This might include non-medical pain relief techniques or injectable pain relief such as epidural analgesia. ? How you and your baby will be monitored during labor and delivery. ? Who may be in the labor and delivery room with you. ? Your feelings about surgical delivery of your baby (cesarean delivery, or C-section) if this becomes necessary. ? Your feelings about receiving donated blood through an IV tube (blood transfusion) if this becomes necessary.  Whether you are able: ? To take pictures or videos of the birth. ? To eat during labor and delivery. ? To move around, walk, or change positions during labor and delivery.  What to expect after your baby is born, such as: ? Whether delayed umbilical cord clamping and cutting is offered. ? Who will care for your baby right after birth. ? Medicines or tests that may be recommended for your baby. ? Whether breastfeeding is supported in your hospital or birth center. ? How long you will be in the hospital or birth center.  How any medical conditions you have may affect your baby or your labor and delivery experience.  To prepare for your baby's birth, you should also:  Attend all of your health care visits before delivery (prenatal visits) as recommended by your health care provider. This is important.  Prepare your home for your baby's  arrival. Make sure that you have: ? Diapers. ? Baby clothing. ? Feeding equipment. ? Safe sleeping arrangements for you and your baby.  Install a car seat in your vehicle. Have your car seat checked by a certified car seat installer to make sure that it is installed safely.  Think about who will help you with your new baby at home for at least the first several weeks after delivery.  What can I expect when I arrive at the birth center or hospital? Once you are in labor and have been admitted into the hospital or birth center, your health care provider may:  Review your pregnancy history and any concerns you have.  Insert an IV tube into one of your veins. This is used to give you fluids and medicines.  Check your blood pressure, pulse, temperature, and heart rate (vital signs).  Check whether your bag of water (amniotic sac) has broken (ruptured).  Talk with you about your birth plan and discuss pain control options.  Monitoring Your health care provider may monitor your contractions (uterine monitoring) and your baby's heart rate (fetal monitoring). You may need to be monitored:  Often, but not continuously (intermittently).  All the time or for long periods at a time (continuously). Continuous monitoring may be needed if: ? You are taking certain medicines, such as medicine to relieve pain or make your contractions stronger. ? You have pregnancy or labor complications.  Monitoring may be done by:  Placing a special stethoscope or a handheld monitoring device on your abdomen to   check your baby's heartbeat, and feeling your abdomen for contractions. This method of monitoring does not continuously record your baby's heartbeat or your contractions.  Placing monitors on your abdomen (external monitors) to record your baby's heartbeat and the frequency and length of contractions. You may not have to wear external monitors all the time.  Placing monitors inside of your uterus  (internal monitors) to record your baby's heartbeat and the frequency, length, and strength of your contractions. ? Your health care provider may use internal monitors if he or she needs more information about the strength of your contractions or your baby's heart rate. ? Internal monitors are put in place by passing a thin, flexible wire through your vagina and into your uterus. Depending on the type of monitor, it may remain in your uterus or on your baby's head until birth. ? Your health care provider will discuss the benefits and risks of internal monitoring with you and will ask for your permission before inserting the monitors.  Telemetry. This is a type of continuous monitoring that can be done with external or internal monitors. Instead of having to stay in bed, you are able to move around during telemetry. Ask your health care provider if telemetry is an option for you.  Physical exam Your health care provider may perform a physical exam. This may include:  Checking whether your baby is positioned: ? With the head toward your vagina (head-down). This is most common. ? With the head toward the top of your uterus (head-up or breech). If your baby is in a breech position, your health care provider may try to turn your baby to a head-down position so you can deliver vaginally. If it does not seem that your baby can be born vaginally, your provider may recommend surgery to deliver your baby. In rare cases, you may be able to deliver vaginally if your baby is head-up (breech delivery). ? Lying sideways (transverse). Babies that are lying sideways cannot be delivered vaginally.  Checking your cervix to determine: ? Whether it is thinning out (effacing). ? Whether it is opening up (dilating). ? How low your baby has moved into your birth canal.  What are the three stages of labor and delivery?  Normal labor and delivery is divided into the following three stages: Stage 1  Stage 1 is the  longest stage of labor, and it can last for hours or days. Stage 1 includes: ? Early labor. This is when contractions may be irregular, or regular and mild. Generally, early labor contractions are more than 10 minutes apart. ? Active labor. This is when contractions get longer, more regular, more frequent, and more intense. ? The transition phase. This is when contractions happen very close together, are very intense, and may last longer than during any other part of labor.  Contractions generally feel mild, infrequent, and irregular at first. They get stronger, more frequent (about every 2-3 minutes), and more regular as you progress from early labor through active labor and transition.  Many women progress through stage 1 naturally, but you may need help to continue making progress. If this happens, your health care provider may talk with you about: ? Rupturing your amniotic sac if it has not ruptured yet. ? Giving you medicine to help make your contractions stronger and more frequent.  Stage 1 ends when your cervix is completely dilated to 4 inches (10 cm) and completely effaced. This happens at the end of the transition phase. Stage 2  Once   your cervix is completely effaced and dilated to 4 inches (10 cm), you may start to feel an urge to push. It is common for the body to naturally take a rest before feeling the urge to push, especially if you received an epidural or certain other pain medicines. This rest period may last for up to 1-2 hours, depending on your unique labor experience.  During stage 2, contractions are generally less painful, because pushing helps relieve contraction pain. Instead of contraction pain, you may feel stretching and burning pain, especially when the widest part of your baby's head passes through the vaginal opening (crowning).  Your health care provider will closely monitor your pushing progress and your baby's progress through the vagina during stage 2.  Your  health care provider may massage the area of skin between your vaginal opening and anus (perineum) or apply warm compresses to your perineum. This helps it stretch as the baby's head starts to crown, which can help prevent perineal tearing. ? In some cases, an incision may be made in your perineum (episiotomy) to allow the baby to pass through the vaginal opening. An episiotomy helps to make the opening of the vagina larger to allow more room for the baby to fit through.  It is very important to breathe and focus so your health care provider can control the delivery of your baby's head. Your health care provider may have you decrease the intensity of your pushing, to help prevent perineal tearing.  After delivery of your baby's head, the shoulders and the rest of the body generally deliver very quickly and without difficulty.  Once your baby is delivered, the umbilical cord may be cut right away, or this may be delayed for 1-2 minutes, depending on your baby's health. This may vary among health care providers, hospitals, and birth centers.  If you and your baby are healthy enough, your baby may be placed on your chest or abdomen to help maintain the baby's temperature and to help you bond with each other. Some mothers and babies start breastfeeding at this time. Your health care team will dry your baby and help keep your baby warm during this time.  Your baby may need immediate care if he or she: ? Showed signs of distress during labor. ? Has a medical condition. ? Was born too early (prematurely). ? Had a bowel movement before birth (meconium). ? Shows signs of difficulty transitioning from being inside the uterus to being outside of the uterus. If you are planning to breastfeed, your health care team will help you begin a feeding. Stage 3  The third stage of labor starts immediately after the birth of your baby and ends after you deliver the placenta. The placenta is an organ that develops  during pregnancy to provide oxygen and nutrients to your baby in the womb.  Delivering the placenta may require some pushing, and you may have mild contractions. Breastfeeding can stimulate contractions to help you deliver the placenta.  After the placenta is delivered, your uterus should tighten (contract) and become firm. This helps to stop bleeding in your uterus. To help your uterus contract and to control bleeding, your health care provider may: ? Give you medicine by injection, through an IV tube, by mouth, or through your rectum (rectally). ? Massage your abdomen or perform a vaginal exam to remove any blood clots that are left in your uterus. ? Empty your bladder by placing a thin, flexible tube (catheter) into your bladder. ? Encourage   you to breastfeed your baby. After labor is over, you and your baby will be monitored closely to ensure that you are both healthy until you are ready to go home. Your health care team will teach you how to care for yourself and your baby. This information is not intended to replace advice given to you by your health care provider. Make sure you discuss any questions you have with your health care provider. Document Released: 02/20/2008 Document Revised: 12/01/2015 Document Reviewed: 05/28/2015 Elsevier Interactive Patient Education  2018 [employer]  

## 2017-09-27 ENCOUNTER — Inpatient Hospital Stay (HOSPITAL_COMMUNITY)
Admission: AD | Admit: 2017-09-27 | Discharge: 2017-09-27 | Disposition: A | Discharge status: Home / Self Care | Payer: Medicaid Other | Source: Ambulatory Visit | Attending: Obstetrics and Gynecology | Admitting: Obstetrics and Gynecology

## 2017-09-27 ENCOUNTER — Other Ambulatory Visit: Payer: Self-pay

## 2017-09-27 DIAGNOSIS — Z3A39 39 weeks gestation of pregnancy: Secondary | ICD-10-CM | POA: Diagnosis not present

## 2017-09-27 DIAGNOSIS — O9989 Other specified diseases and conditions complicating pregnancy, childbirth and the puerperium: Secondary | ICD-10-CM

## 2017-09-27 DIAGNOSIS — Z283 Underimmunization status: Secondary | ICD-10-CM

## 2017-09-27 DIAGNOSIS — Z3483 Encounter for supervision of other normal pregnancy, third trimester: Secondary | ICD-10-CM | POA: Diagnosis present

## 2017-09-27 DIAGNOSIS — O479 False labor, unspecified: Secondary | ICD-10-CM

## 2017-09-27 DIAGNOSIS — Z3400 Encounter for supervision of normal first pregnancy, unspecified trimester: Secondary | ICD-10-CM

## 2017-09-27 DIAGNOSIS — Z2839 Other underimmunization status: Secondary | ICD-10-CM

## 2017-09-27 NOTE — Progress Notes (Addendum)
  G1 @ 39.[redacted] wksga. Here dt ctx for past week. Membrane stripped on Thursday. Pt rates pain 1-2/10. Feels tightening or lil cramps. Denies LOF with mucus plug loss. +FM.  VE 3/50/ballotable.   1231: Provider notified. Will call back once completed with care of another pt.   1228: Provider notified. Report given. Ordered to dc pt home with labor precaution.   1330: D/c instructions given with pt understanding. Pt left unit via ambulatory with family

## 2017-09-27 NOTE — MAU Note (Signed)
Contractions, ? Frequency, 'all over the place", some bloody show, no water leaking.

## 2017-09-27 NOTE — Discharge Instructions (Signed)
Braxton Hicks Contractions °Contractions of the uterus can occur throughout pregnancy, but they are not always a sign that you are in labor. You may have practice contractions called Braxton Hicks contractions. These false labor contractions are sometimes confused with true labor. °What are Braxton Hicks contractions? °Braxton Hicks contractions are tightening movements that occur in the muscles of the uterus before labor. Unlike true labor contractions, these contractions do not result in opening (dilation) and thinning of the cervix. Toward the end of pregnancy (32-34 weeks), Braxton Hicks contractions can happen more often and may become stronger. These contractions are sometimes difficult to tell apart from true labor because they can be very uncomfortable. You should not feel embarrassed if you go to the hospital with false labor. °Sometimes, the only way to tell if you are in true labor is for your health care provider to look for changes in the cervix. The health care provider will do a physical exam and may monitor your contractions. If you are not in true labor, the exam should show that your cervix is not dilating and your water has not broken. °If there are other health problems associated with your pregnancy, it is completely safe for you to be sent home with false labor. You may continue to have Braxton Hicks contractions until you go into true labor. °How to tell the difference between true labor and false labor °True labor °· Contractions last 30-70 seconds. °· Contractions become very regular. °· Discomfort is usually felt in the top of the uterus, and it spreads to the lower abdomen and low back. °· Contractions do not go away with walking. °· Contractions usually become more intense and increase in frequency. °· The cervix dilates and gets thinner. °False labor °· Contractions are usually shorter and not as strong as true labor contractions. °· Contractions are usually irregular. °· Contractions  are often felt in the front of the lower abdomen and in the groin. °· Contractions may go away when you walk around or change positions while lying down. °· Contractions get weaker and are shorter-lasting as time goes on. °· The cervix usually does not dilate or become thin. °Follow these instructions at home: °· Take over-the-counter and prescription medicines only as told by your health care provider. °· Keep up with your usual exercises and follow other instructions from your health care provider. °· Eat and drink lightly if you think you are going into labor. °· If Braxton Hicks contractions are making you uncomfortable: °? Change your position from lying down or resting to walking, or change from walking to resting. °? Sit and rest in a tub of warm water. °? Drink enough fluid to keep your urine pale yellow. Dehydration may cause these contractions. °? Do slow and deep breathing several times an hour. °· Keep all follow-up prenatal visits as told by your health care provider. This is important. °Contact a health care provider if: °· You have a fever. °· You have continuous pain in your abdomen. °Get help right away if: °· Your contractions become stronger, more regular, and closer together. °· You have fluid leaking or gushing from your vagina. °· You pass blood-tinged mucus (bloody show). °· You have bleeding from your vagina. °· You have low back pain that you never had before. °· You feel your baby’s head pushing down and causing pelvic pressure. °· Your baby is not moving inside you as much as it used to. °Summary °· Contractions that occur before labor are called Braxton   Hicks contractions, false labor, or practice contractions. °· Braxton Hicks contractions are usually shorter, weaker, farther apart, and less regular than true labor contractions. True labor contractions usually become progressively stronger and regular and they become more frequent. °· Manage discomfort from Braxton Hicks contractions by  changing position, resting in a warm bath, drinking plenty of water, or practicing deep breathing. °This information is not intended to replace advice given to you by your health care provider. Make sure you discuss any questions you have with your health care provider. °Document Released: 09/26/2016 Document Revised: 09/26/2016 Document Reviewed: 09/26/2016 °Elsevier Interactive Patient Education © 2018 [employer] ° °

## 2017-10-01 ENCOUNTER — Inpatient Hospital Stay (HOSPITAL_COMMUNITY)
Admission: AD | Admit: 2017-10-01 | Discharge: 2017-10-01 | Disposition: A | Discharge status: Home / Self Care | Payer: Medicaid Other | Source: Ambulatory Visit | Attending: Obstetrics & Gynecology | Admitting: Obstetrics & Gynecology

## 2017-10-01 ENCOUNTER — Encounter (HOSPITAL_COMMUNITY): Payer: Self-pay

## 2017-10-01 ENCOUNTER — Other Ambulatory Visit: Payer: Self-pay

## 2017-10-01 DIAGNOSIS — O479 False labor, unspecified: Secondary | ICD-10-CM

## 2017-10-01 NOTE — MAU Note (Signed)
I have communicated with Dr. Alvan Dame and reviewed vital signs:  Vitals:   10/01/17 1521 10/01/17 1558  BP: 123/76 117/83  Pulse: 95 91  Resp: 18   Temp: 98.7 F (37.1 C)   SpO2: 96%     Vaginal exam:  Dilation: 3 Effacement (%): 50 Cervical Position: Posterior Station: Ballotable Presentation: Vertex Exam by:: Zenia Resides, RN ,   Also reviewed contraction pattern and that non-stress test is reactive.  It has been documented that patient is contracting irregularly  with no cervical change since her last cervical exam on 09/27/2017 not indicating active labor.  Patient denies any other complaints.  Based on this report provider has given order for discharge.  A discharge order and diagnosis entered by a provider.   Labor discharge instructions reviewed with patient.

## 2017-10-01 NOTE — Discharge Instructions (Signed)
Braxton Hicks Contractions °Contractions of the uterus can occur throughout pregnancy, but they are not always a sign that you are in labor. You may have practice contractions called Braxton Hicks contractions. These false labor contractions are sometimes confused with true labor. °What are Braxton Hicks contractions? °Braxton Hicks contractions are tightening movements that occur in the muscles of the uterus before labor. Unlike true labor contractions, these contractions do not result in opening (dilation) and thinning of the cervix. Toward the end of pregnancy (32-34 weeks), Braxton Hicks contractions can happen more often and may become stronger. These contractions are sometimes difficult to tell apart from true labor because they can be very uncomfortable. You should not feel embarrassed if you go to the hospital with false labor. °Sometimes, the only way to tell if you are in true labor is for your health care provider to look for changes in the cervix. The health care provider will do a physical exam and may monitor your contractions. If you are not in true labor, the exam should show that your cervix is not dilating and your water has not broken. °If there are other health problems associated with your pregnancy, it is completely safe for you to be sent home with false labor. You may continue to have Braxton Hicks contractions until you go into true labor. °How to tell the difference between true labor and false labor °True labor °· Contractions last 30-70 seconds. °· Contractions become very regular. °· Discomfort is usually felt in the top of the uterus, and it spreads to the lower abdomen and low back. °· Contractions do not go away with walking. °· Contractions usually become more intense and increase in frequency. °· The cervix dilates and gets thinner. °False labor °· Contractions are usually shorter and not as strong as true labor contractions. °· Contractions are usually irregular. °· Contractions  are often felt in the front of the lower abdomen and in the groin. °· Contractions may go away when you walk around or change positions while lying down. °· Contractions get weaker and are shorter-lasting as time goes on. °· The cervix usually does not dilate or become thin. °Follow these instructions at home: °· Take over-the-counter and prescription medicines only as told by your health care provider. °· Keep up with your usual exercises and follow other instructions from your health care provider. °· Eat and drink lightly if you think you are going into labor. °· If Braxton Hicks contractions are making you uncomfortable: °? Change your position from lying down or resting to walking, or change from walking to resting. °? Sit and rest in a tub of warm water. °? Drink enough fluid to keep your urine pale yellow. Dehydration may cause these contractions. °? Do slow and deep breathing several times an hour. °· Keep all follow-up prenatal visits as told by your health care provider. This is important. °Contact a health care provider if: °· You have a fever. °· You have continuous pain in your abdomen. °Get help right away if: °· Your contractions become stronger, more regular, and closer together. °· You have fluid leaking or gushing from your vagina. °· You pass blood-tinged mucus (bloody show). °· You have bleeding from your vagina. °· You have low back pain that you never had before. °· You feel your baby’s head pushing down and causing pelvic pressure. °· Your baby is not moving inside you as much as it used to. °Summary °· Contractions that occur before labor are called Braxton   Hicks contractions, false labor, or practice contractions. °· Braxton Hicks contractions are usually shorter, weaker, farther apart, and less regular than true labor contractions. True labor contractions usually become progressively stronger and regular and they become more frequent. °· Manage discomfort from Braxton Hicks contractions by  changing position, resting in a warm bath, drinking plenty of water, or practicing deep breathing. °This information is not intended to replace advice given to you by your health care provider. Make sure you discuss any questions you have with your health care provider. °Document Released: 09/26/2016 Document Revised: 09/26/2016 Document Reviewed: 09/26/2016 °Elsevier Interactive Patient Education © 2018 [employer] ° °

## 2017-10-01 NOTE — MAU Note (Signed)
Pt states she starting having pain at 0700 this morning until 0900. She states that now she now is having lower abdominal pain 3/10 that comes and goes.   Denies vaginal bleeding, or LOF Reports good fetal movement.

## 2017-10-02 ENCOUNTER — Ambulatory Visit: Payer: Self-pay

## 2017-10-02 ENCOUNTER — Other Ambulatory Visit: Payer: Self-pay

## 2017-10-02 ENCOUNTER — Encounter: Payer: Self-pay | Admitting: Medical

## 2017-10-02 ENCOUNTER — Inpatient Hospital Stay (HOSPITAL_COMMUNITY)
Admission: AD | Admit: 2017-10-02 | Discharge: 2017-10-05 | DRG: 807 | Disposition: A | Discharge status: Home / Self Care | Payer: Medicaid Other | Source: Ambulatory Visit | Attending: Obstetrics & Gynecology | Admitting: Obstetrics & Gynecology

## 2017-10-02 ENCOUNTER — Ambulatory Visit (INDEPENDENT_AMBULATORY_CARE_PROVIDER_SITE_OTHER): Payer: Medicaid Other | Admitting: Medical

## 2017-10-02 ENCOUNTER — Encounter (HOSPITAL_COMMUNITY): Payer: Self-pay | Admitting: *Deleted

## 2017-10-02 VITALS — BP 119/77 | HR 94 | Wt 205.4 lb

## 2017-10-02 DIAGNOSIS — Z3403 Encounter for supervision of normal first pregnancy, third trimester: Secondary | ICD-10-CM

## 2017-10-02 DIAGNOSIS — O480 Post-term pregnancy: Secondary | ICD-10-CM

## 2017-10-02 DIAGNOSIS — O09899 Supervision of other high risk pregnancies, unspecified trimester: Secondary | ICD-10-CM

## 2017-10-02 DIAGNOSIS — O4103X0 Oligohydramnios, third trimester, not applicable or unspecified: Principal | ICD-10-CM | POA: Diagnosis present

## 2017-10-02 DIAGNOSIS — O4100X0 Oligohydramnios, unspecified trimester, not applicable or unspecified: Secondary | ICD-10-CM | POA: Diagnosis present

## 2017-10-02 DIAGNOSIS — Z3400 Encounter for supervision of normal first pregnancy, unspecified trimester: Secondary | ICD-10-CM

## 2017-10-02 DIAGNOSIS — Z3A40 40 weeks gestation of pregnancy: Secondary | ICD-10-CM | POA: Diagnosis not present

## 2017-10-02 DIAGNOSIS — Z283 Underimmunization status: Secondary | ICD-10-CM

## 2017-10-02 DIAGNOSIS — O9989 Other specified diseases and conditions complicating pregnancy, childbirth and the puerperium: Secondary | ICD-10-CM

## 2017-10-02 LAB — COMPREHENSIVE METABOLIC PANEL
ALBUMIN: 3.1 g/dL — AB (ref 3.5–5.0)
ALT: 12 U/L — AB (ref 14–54)
AST: 18 U/L (ref 15–41)
Alkaline Phosphatase: 136 U/L — ABNORMAL HIGH (ref 38–126)
Anion gap: 11 (ref 5–15)
BUN: 7 mg/dL (ref 6–20)
CHLORIDE: 107 mmol/L (ref 101–111)
CO2: 17 mmol/L — AB (ref 22–32)
CREATININE: 0.41 mg/dL — AB (ref 0.44–1.00)
Calcium: 9 mg/dL (ref 8.9–10.3)
GFR calc Af Amer: >60 mL/min (ref >60)
GFR calc non Af Amer: >60 mL/min (ref >60)
GLUCOSE: 80 mg/dL (ref 65–99)
Potassium: 4 mmol/L (ref 3.5–5.1)
SODIUM: 135 mmol/L (ref 135–145)
Total Bilirubin: 0.4 mg/dL (ref 0.3–1.2)
Total Protein: 6.6 g/dL (ref 6.5–8.1)

## 2017-10-02 LAB — ABO/RH: ABO/RH(D): A POS

## 2017-10-02 LAB — TYPE AND SCREEN
ABO/RH(D): A POS
Antibody Screen: NEGATIVE

## 2017-10-02 LAB — CBC
HEMATOCRIT: 36.4 % (ref 36.0–46.0)
Hemoglobin: 12.6 g/dL (ref 12.0–15.0)
MCH: 32.7 pg (ref 26.0–34.0)
MCHC: 34.6 g/dL (ref 30.0–36.0)
MCV: 94.5 fL (ref 78.0–100.0)
Platelets: 179 10*3/uL (ref 150–400)
RBC: 3.85 MIL/uL — AB (ref 3.87–5.11)
RDW: 12.5 % (ref 11.5–15.5)
WBC: 12.8 10*3/uL — AB (ref 4.0–10.5)

## 2017-10-02 MED ORDER — FENTANYL CITRATE (PF) 100 MCG/2ML IJ SOLN
100.0000 ug | INTRAMUSCULAR | Status: DC | PRN
  Administered 2017-10-02 (×2): 100 ug via INTRAVENOUS
  Filled 2017-10-02 (×3): qty 2

## 2017-10-02 MED ORDER — FLEET ENEMA 7-19 GM/118ML RE ENEM: 1.0000 | ENEMA | RECTAL | Status: DC | PRN

## 2017-10-02 MED ORDER — LACTATED RINGERS IV SOLN
INTRAVENOUS | Status: DC
  Administered 2017-10-02: 22:00:00 via INTRAUTERINE

## 2017-10-02 MED ORDER — OXYTOCIN 40 UNITS IN LACTATED RINGERS INFUSION - SIMPLE MED
2.5000 [IU]/h | INTRAVENOUS | Status: DC
  Filled 2017-10-02: qty 1000

## 2017-10-02 MED ORDER — OXYCODONE-ACETAMINOPHEN 5-325 MG PO TABS
2.0000 | ORAL_TABLET | ORAL | Status: DC | PRN
  Administered 2017-10-03: 2 via ORAL
  Filled 2017-10-02: qty 2

## 2017-10-02 MED ORDER — ACETAMINOPHEN 325 MG PO TABS: 650.0000 mg | ORAL_TABLET | ORAL | Status: DC | PRN

## 2017-10-02 MED ORDER — TERBUTALINE SULFATE 1 MG/ML IJ SOLN
0.2500 mg | Freq: Once | INTRAMUSCULAR | Status: AC | PRN
  Administered 2017-10-02: 0.25 mg via SUBCUTANEOUS
  Filled 2017-10-02: qty 1

## 2017-10-02 MED ORDER — OXYTOCIN 40 UNITS IN LACTATED RINGERS INFUSION - SIMPLE MED
1.0000 m[IU]/min | INTRAVENOUS | Status: DC
  Administered 2017-10-02: 2 m[IU]/min via INTRAVENOUS
  Administered 2017-10-02: 8 m[IU]/min via INTRAVENOUS
  Administered 2017-10-02: 10 m[IU]/min via INTRAVENOUS
  Administered 2017-10-02: 4 m[IU]/min via INTRAVENOUS
  Administered 2017-10-02: 6 m[IU]/min via INTRAVENOUS

## 2017-10-02 MED ORDER — MISOPROSTOL 50MCG HALF TABLET
50.0000 ug | ORAL_TABLET | ORAL | Status: DC
  Administered 2017-10-02: 50 ug via BUCCAL
  Filled 2017-10-02 (×2): qty 1

## 2017-10-02 MED ORDER — LACTATED RINGERS IV SOLN
INTRAVENOUS | Status: DC
  Administered 2017-10-02 (×3): via INTRAVENOUS

## 2017-10-02 MED ORDER — LIDOCAINE HCL (PF) 1 % IJ SOLN
30.0000 mL | INTRAMUSCULAR | Status: AC | PRN
  Administered 2017-10-03: 30 mL via SUBCUTANEOUS
  Filled 2017-10-02: qty 30

## 2017-10-02 MED ORDER — SOD CITRATE-CITRIC ACID 500-334 MG/5ML PO SOLN
30.0000 mL | ORAL | Status: DC | PRN
  Filled 2017-10-02: qty 15

## 2017-10-02 MED ORDER — OXYTOCIN BOLUS FROM INFUSION
500.0000 mL | Freq: Once | INTRAVENOUS | Status: AC
  Administered 2017-10-03: 500 mL via INTRAVENOUS

## 2017-10-02 MED ORDER — OXYCODONE-ACETAMINOPHEN 5-325 MG PO TABS
1.0000 | ORAL_TABLET | ORAL | Status: DC | PRN
  Administered 2017-10-04: 1 via ORAL
  Filled 2017-10-02: qty 1

## 2017-10-02 MED ORDER — LACTATED RINGERS IV SOLN
500.0000 mL | INTRAVENOUS | Status: DC | PRN
  Administered 2017-10-02 (×3): 500 mL via INTRAVENOUS

## 2017-10-02 MED ORDER — ONDANSETRON HCL 4 MG/2ML IJ SOLN: 4.0000 mg | Freq: Four times a day (QID) | INTRAMUSCULAR | Status: DC | PRN

## 2017-10-02 NOTE — Patient Instructions (Signed)

## 2017-10-02 NOTE — Progress Notes (Signed)

## 2017-10-02 NOTE — Progress Notes (Signed)
Subjective: Doing well with no issues. Pain well controlled without any medications at this time.  Objective: BP 119/67   Pulse 73   Temp 98.1 F (36.7 C) (Oral)   Resp 16   Ht  (1.575 m)   Wt 93.2 kg (205 lb 6.4 oz)   LMP 12/25/2016 (Approximate)   BMI 37.57 kg/m  No intake/output data recorded. No intake/output data recorded.  FHT:  FHR: 150 bpm, variability: moderate,  accelerations:  Present,  decelerations:  Absent UC:   regular, every 2-3 minutes SVE:   Dilation: 5 Effacement (%): 70 Station: -2 Exam by:: Dr. Primitivo Gauze   Labs: Lab Results  Component Value Date   WBC 12.8 (H) 10/02/2017   HGB 12.6 10/02/2017   HCT 36.4 10/02/2017   MCV 94.5 10/02/2017   PLT 179 10/02/2017    Assessment / Plan: Induction of labor due to oligohydramnios. S/P cytotec, progressing well on pitocin. AROM when appropriate.  Labor: pitocin, s/p cytotec. AROM when appropriate. Preeclampsia:  N/A Fetal Wellbeing:  Category I Pain Control:  IV pain meds I/D:  n/a Anticipated MOD:  NSVD   Myrene Buddy 10/02/2017, 6:06 PM

## 2017-10-02 NOTE — Progress Notes (Signed)
Labor Progress Note  Adrienne Craig is a 22 y.o. G1P0 at [redacted]w[redacted]d  admitted for induction of labor due to Low amniotic fluid..  S: Uncomfortable with contractions but does not one epidural.   O:  BP 129/87   Pulse 71   Temp 98.2 F (36.8 C) (Oral)   Resp 20   Ht  (1.575 m)   Wt 93.2 kg (205 lb 6.4 oz)   LMP 12/25/2016 (Approximate)   SpO2 99%   BMI 37.57 kg/m   No intake/output data recorded.  FHT:  FHR: 120 bpm, variability: moderate,  accelerations:  Present,  decelerations:  Present variable UC:   regular, every 1-3 minutes SVE:   Dilation: 5 Effacement (%): 70 Station: -2 Exam by:: Marius Ditch, RN SROM: clear 1840   Labs: Lab Results  Component Value Date   WBC 12.8 (H) 10/02/2017   HGB 12.6 10/02/2017   HCT 36.4 10/02/2017   MCV 94.5 10/02/2017   PLT 179 10/02/2017    Assessment / Plan: 22 y.o. G1P0 [redacted]w[redacted]d in active labor Induction of labor due to oligo,  progressing well on pitocin  Labor: Progressing normally, Pitocin tuned off due to tachysystole. IUPC placed and amnioinfusion started due to variables Fetal Wellbeing:  Category II Pain Control:  IV pain meds Anticipated MOD:  NSVD  Expectant management   Caryl Ada, DO OB Fellow Center for [facility], [facility]

## 2017-10-02 NOTE — Progress Notes (Signed)
Subjective: Doing well with no issues. Pain well controlled.   Objective: BP 113/76   Pulse 65   Temp 98.5 F (36.9 C) (Oral)   Resp 16   Ht  (1.575 m)   Wt 93.2 kg (205 lb 6.4 oz)   LMP 12/25/2016 (Approximate)   BMI 37.57 kg/m  No intake/output data recorded. No intake/output data recorded.  FHT:  FHR: 150 bpm, variability: moderate,  accelerations:  Present,  decelerations:  Absent UC:   irregular, every 6-7 minutes SVE:   Dilation: 2.5 Effacement (%): 50 Station: -3 Exam by:: Viona Gilmore RN  Labs: Lab Results  Component Value Date   WBC 12.8 (H) 10/02/2017   HGB 12.6 10/02/2017   HCT 36.4 10/02/2017   MCV 94.5 10/02/2017   PLT 179 10/02/2017    Assessment / Plan: Induction of labor due to oligohydramnios. S/P cytotec, likely start pitocin when progresses a little more. AROM when appropriate.  Labor: s/p cytotec, pitocin when ripe. AROM when appropriate Preeclampsia:  N/A Fetal Wellbeing:  Category I Pain Control:  IV pain meds I/D:  n/a Anticipated MOD:  NSVD  Myrene Buddy 10/02/2017, 2:06 PM

## 2017-10-02 NOTE — Progress Notes (Signed)
Called into patient room due to prolonged decel. Patient with oxygen on and FSE. Other resuscitative measures used such as hands and knees, fluid bolus, and terbutaline given. Pitocin was already discontinued. IUPC with amnioinfusion in place. Fetus with recovery in heart rate. Cervical exam with rapid changes 9/100/-1. Variability has been moderate. Positive fetal scalp stimulation. Fetal heart tracing reassuring at this time. Will continue to watch labor curve. Expectant management.   Jazma PheCaryl AdaB Fellow Center for [facility], [facility]

## 2017-10-02 NOTE — Anesthesia Pain Management Evaluation Note (Signed)
  CRNA Pain Management Visit Note  Patient: Adrienne Craig, 22 y.o., female  "Hello I am a member of the anesthesia team at [facility]. We have an anesthesia team available at all times to provide care throughout the hospital, including epidural management and anesthesia for C-section. I don't know your plan for the delivery whether it a natural birth, water birth, IV sedation, nitrous supplementation, doula or epidural, but we want to meet your pain goals."   1.Was your pain managed to your expectations on prior hospitalizations?   Yes   2.What is your expectation for pain management during this hospitalization?     Epidural and IV pain meds  3.How can we help you reach that goal? Medication as indicated. Getting ready to start Pit  Record the patient's initial score and the patient's pain goal.   Pain: 0  Pain Goal: 5 [facility] wants you to be able to say your pain was always managed very well.  Cleda Clarks 10/02/2017

## 2017-10-02 NOTE — H&P (Addendum)
OBSTETRIC ADMISSION HISTORY AND PHYSICAL  Adrienne Craig is a 22 y.o. female G1P0 with IUP at [redacted]w[redacted]d by LMP and [place] presenting for IOL for oligohydramnios. Patient evaluated in clinic this am, BPP 8/10, AFI=3. She reports +FMs, No LOF, no VB, no blurry vision, headaches or peripheral edema, and RUQ pain.  She plans on breast feeding. She request natural family planning for birth control. She received her prenatal care at [facility]   Dating: By LMP and [place] --->  Estimated Date of Delivery: 10/01/17  Sono:    , CWD, normal anatomy, breech presentation, anterior placenta, 362g, 58% EFW   Prenatal History/Complications: -Oligo -Rubella non-immune  Past Medical History: Past Medical History:  Diagnosis Date  . Medical history non-contributory     Past Surgical History: Past Surgical History:  Procedure Laterality Date  . NO PAST SURGERIES      Obstetrical History: OB History    Gravida  1   Para      Term      Preterm      AB      Living        SAB      TAB      Ectopic      Multiple      Live Births              Social History: Social History   Socioeconomic History  . Marital status: Single    Spouse name: Not on file  . Number of children: Not on file  . Years of education: Not on file  . Highest education level: Not on file  Occupational History  . Not on file  Social Needs  . Financial resource strain: Not on file  . Food insecurity:    Worry: Not on file    Inability: Not on file  . Transportation needs:    Medical: Not on file    Non-medical: Not on file  Tobacco Use  . Smoking status: Never Smoker  . Smokeless tobacco: Never Used  Substance and Sexual Activity  . Alcohol use: No  . Drug use: No  . Sexual activity: Yes    Birth control/protection: None  Lifestyle  . Physical activity:    Days per week: Not on file    Minutes per session: Not on file  . Stress: Not on file  Relationships  . Social connections:    Talks on  phone: Not on file    Gets together: Not on file    Attends religious service: Not on file    Active member of club or organization: Not on file    Attends meetings of clubs or organizations: Not on file    Relationship status: Not on file  Other Topics Concern  . Not on file  Social History Narrative  . Not on file    Family History: Family History  Problem Relation Age of Onset  . Diabetes Mother   . Arthritis Mother     Allergies: Allergies  Allergen Reactions  . Mango Flavor [Flavoring Agent] Rash    Medications Prior to Admission  Medication Sig Dispense Refill Last Dose  . prenatal vitamin w/FE, FA (PRENATAL 1 + 1) 27-1 MG TABS tablet Take 1 tablet by mouth daily at 12 noon.   Taking     Review of Systems   All systems reviewed and negative except as stated in HPI  Height  (1.575 m), weight 93.2 kg (205 lb 6.4 oz), last menstrual period  12/25/2016. General appearance: alert and cooperative Lungs: clear to auscultation bilaterally Heart: regular rate and rhythm Abdomen: soft, non-tender; bowel sounds normal Extremities: Homans sign is negative, no sign of DVT Presentation: cephalic Fetal monitoringBaseline: 130 bpm, Variability: Fair (1-6 bpm), Accelerations: Reactive and Decelerations: Absent Uterine activityFrequency: Every 6-7 minutes, irregular    Prenatal labs: ABO, Rh: A/Positive/-- (11/05 1511) Antibody: Negative (11/05 1511) Rubella: <0.90 (11/05 1511) RPR: Non Reactive (02/12 0917)  HBsAg: Negative (11/05 1511)  HIV: Non Reactive (02/12 0917)  GBS:   negative Glucose: 1 hour/fasting/2 hour-> 116/69/91 Genetic screening  normal Anatomy US normal  Prenatal Transfer Tool  Maternal Diabetes: No Genetic Screening: Normal Maternal Ultrasounds/Referrals: Normal Fetal Ultrasounds or other Referrals:  None Maternal Substance Abuse:  No Significant Maternal Medications:  None Significant Maternal Lab Results: None  No results found for this  or any previous visit (from the past 24 hour(s)).  Patient Active Problem List   Diagnosis Date Noted  . Oligohydramnios 10/02/2017  . Rubella non-immune status, antepartum 04/07/2017  . Supervision of normal first pregnancy, antepartum 03/31/2017    Assessment/Plan:  Adrienne Craig is a 22 y.o. G1P0 at [redacted]w[redacted]d here for IOL for oligohydramnios. BPP of 8/10 in clinic, AFI=3. Low risk pregnancy. GBS negative. 3/50/ballotable in office. Has not changed since that time. Will start with cytotec for cervical ripening.  #Labor:cytotec, likely pitocin after #Pain: Wants to try just iv pain meds, but will get epidural if she needs to #FWB: Cat 1 #ID:  gbs neg #MOF: breast #MOC:nfp #Circ:  no  Myrene Buddy, MD  10/02/2017, 10:54 AM   Midwife attestation: I have seen and examined this patient; I agree with above documentation in the resident's note.   Adrienne Craig is a 22 y.o. G1P0 here for IOL for Oligo  PE: Gen: calm comfortable, NAD Resp: normal effort, no distress Abd: gravid  ROS, labs, PMH reviewed  Assessment/Plan: Admit to LD Labor: latent FWB: Cat I ID: [id number]  Donette Larry, CNM  10/02/2017, 12:12 PM

## 2017-10-02 NOTE — Progress Notes (Signed)
   PRENATAL VISIT NOTE  Subjective:  Adrienne Craig is a 22 y.o. G1P0 at [redacted]w[redacted]d being seen today for ongoing prenatal care.  She is currently monitored for the following issues for this low-risk pregnancy and has Supervision of normal first pregnancy, antepartum and Rubella non-immune status, antepartum on their problem list.  Patient reports no complaints.  Contractions: Irregular. Vag. Bleeding: None.  Movement: Present. Denies leaking of fluid.   The following portions of the patient's history were reviewed and updated as appropriate: allergies, current medications, past family history, past medical history, past social history, past surgical history and problem list. Problem list updated.  Objective:   Vitals:   10/02/17 0839  BP: 119/77  Pulse: 94  Weight: 205 lb 6.4 oz (93.2 kg)    Fetal Status: Fetal Heart Rate (bpm): NST   Movement: Present  Presentation: Vertex  General:  Alert, oriented and cooperative. Patient is in no acute distress.  Skin: Skin is warm and dry. No rash noted.   Cardiovascular: Normal heart rate noted  Respiratory: Normal respiratory effort, no problems with respiration noted  Abdomen: Soft, gravid, appropriate for gestational age.  Pain/Pressure: Present     Pelvic: Cervical exam deferred        Extremities: Normal range of motion.     Mental Status: Normal mood and affect. Normal behavior. Normal judgment and thought content.   Assessment and Plan:  Pregnancy: G1P0 at [redacted]w[redacted]d  1. Supervision of normal first pregnancy, antepartum  2. Post term pregnancy, antepartum condition or complication - US FETAL BPP W/NONSTRESS; 8/10 today, AFI = 3 - Discussed patient with Dr. Macon Large. Admit for IOL for oligohydramnios today. Resident and Dr. Debroah Loop notified. Patient will be escorted to admitting.   Fetal Monitoring: Baseline: 130 bpm Variability: moderate Accelerations: 15 x 15 Decelerations: none Contractions: few, irregular   Term labor symptoms and  general obstetric precautions including but not limited to vaginal bleeding, contractions, leaking of fluid and fetal movement were reviewed in detail with the patient. Please refer to After Visit Summary for other counseling recommendations.  Return in about 5 weeks (around 11/06/2017) for PP visit.  No future appointments.  Vonzella Nipple, PA-C

## 2017-10-03 ENCOUNTER — Inpatient Hospital Stay (HOSPITAL_COMMUNITY): Payer: Medicaid Other | Admitting: Anesthesiology

## 2017-10-03 ENCOUNTER — Encounter (HOSPITAL_COMMUNITY): Payer: Self-pay

## 2017-10-03 ENCOUNTER — Other Ambulatory Visit: Payer: Self-pay

## 2017-10-03 DIAGNOSIS — O4103X0 Oligohydramnios, third trimester, not applicable or unspecified: Secondary | ICD-10-CM

## 2017-10-03 DIAGNOSIS — S42009A Fracture of unspecified part of unspecified clavicle, initial encounter for closed fracture: Secondary | ICD-10-CM

## 2017-10-03 DIAGNOSIS — Z3A40 40 weeks gestation of pregnancy: Secondary | ICD-10-CM

## 2017-10-03 LAB — RPR: RPR: NONREACTIVE

## 2017-10-03 MED ORDER — PRENATAL MULTIVITAMIN CH
1.0000 | ORAL_TABLET | Freq: Every day | ORAL | Status: DC
  Administered 2017-10-03 – 2017-10-04 (×2): 1 via ORAL
  Filled 2017-10-03 (×2): qty 1

## 2017-10-03 MED ORDER — SIMETHICONE 80 MG PO CHEW: 80.0000 mg | CHEWABLE_TABLET | ORAL | Status: DC | PRN

## 2017-10-03 MED ORDER — PHENYLEPHRINE 40 MCG/ML (10ML) SYRINGE FOR IV PUSH (FOR BLOOD PRESSURE SUPPORT)
80.0000 ug | PREFILLED_SYRINGE | INTRAVENOUS | Status: DC | PRN
  Filled 2017-10-03: qty 5
  Filled 2017-10-03: qty 10

## 2017-10-03 MED ORDER — DIPHENHYDRAMINE HCL 25 MG PO CAPS: 25.0000 mg | ORAL_CAPSULE | Freq: Four times a day (QID) | ORAL | Status: DC | PRN

## 2017-10-03 MED ORDER — ONDANSETRON HCL 4 MG PO TABS: 4.0000 mg | ORAL_TABLET | ORAL | Status: DC | PRN

## 2017-10-03 MED ORDER — DIBUCAINE 1 % RE OINT: 1.0000 "application " | TOPICAL_OINTMENT | RECTAL | Status: DC | PRN

## 2017-10-03 MED ORDER — EPHEDRINE 5 MG/ML INJ: 10.0000 mg | INTRAVENOUS | Status: DC | PRN

## 2017-10-03 MED ORDER — TETANUS-DIPHTH-ACELL PERTUSSIS 5-2.5-18.5 LF-MCG/0.5 IM SUSP: 0.5000 mL | Freq: Once | INTRAMUSCULAR | Status: DC

## 2017-10-03 MED ORDER — DIPHENHYDRAMINE HCL 50 MG/ML IJ SOLN: 12.5000 mg | INTRAMUSCULAR | Status: DC | PRN

## 2017-10-03 MED ORDER — IBUPROFEN 600 MG PO TABS
600.0000 mg | ORAL_TABLET | Freq: Four times a day (QID) | ORAL | Status: DC
  Administered 2017-10-03 – 2017-10-05 (×7): 600 mg via ORAL
  Filled 2017-10-03 (×7): qty 1

## 2017-10-03 MED ORDER — COCONUT OIL OIL
1.0000 "application " | TOPICAL_OIL | Status: DC | PRN
  Administered 2017-10-05: 1 via TOPICAL
  Filled 2017-10-03: qty 120

## 2017-10-03 MED ORDER — ZOLPIDEM TARTRATE 5 MG PO TABS: 5.0000 mg | ORAL_TABLET | Freq: Every evening | ORAL | Status: DC | PRN

## 2017-10-03 MED ORDER — LIDOCAINE HCL (PF) 1 % IJ SOLN
INTRAMUSCULAR | Status: DC | PRN
  Administered 2017-10-03: 5 mL via EPIDURAL

## 2017-10-03 MED ORDER — PHENYLEPHRINE 40 MCG/ML (10ML) SYRINGE FOR IV PUSH (FOR BLOOD PRESSURE SUPPORT): 80.0000 ug | PREFILLED_SYRINGE | INTRAVENOUS | Status: DC | PRN

## 2017-10-03 MED ORDER — LACTATED RINGERS IV SOLN: 500.0000 mL | Freq: Once | INTRAVENOUS | Status: DC

## 2017-10-03 MED ORDER — PHENYLEPHRINE 40 MCG/ML (10ML) SYRINGE FOR IV PUSH (FOR BLOOD PRESSURE SUPPORT)
80.0000 ug | PREFILLED_SYRINGE | INTRAVENOUS | Status: DC | PRN
  Filled 2017-10-03: qty 5

## 2017-10-03 MED ORDER — ONDANSETRON HCL 4 MG/2ML IJ SOLN: 4.0000 mg | INTRAMUSCULAR | Status: DC | PRN

## 2017-10-03 MED ORDER — WITCH HAZEL-GLYCERIN EX PADS: 1.0000 "application " | MEDICATED_PAD | CUTANEOUS | Status: DC | PRN

## 2017-10-03 MED ORDER — EPHEDRINE 5 MG/ML INJ
10.0000 mg | INTRAVENOUS | Status: DC | PRN
  Filled 2017-10-03: qty 2

## 2017-10-03 MED ORDER — FENTANYL 2.5 MCG/ML BUPIVACAINE 1/10 % EPIDURAL INFUSION (WH - ANES)
14.0000 mL/h | INTRAMUSCULAR | Status: DC | PRN
  Administered 2017-10-03: 14 mL/h via EPIDURAL
  Filled 2017-10-03: qty 100

## 2017-10-03 MED ORDER — BENZOCAINE-MENTHOL 20-0.5 % EX AERO
1.0000 "application " | INHALATION_SPRAY | CUTANEOUS | Status: DC | PRN
  Administered 2017-10-04: 1 via TOPICAL
  Filled 2017-10-03: qty 56

## 2017-10-03 MED ORDER — SENNOSIDES-DOCUSATE SODIUM 8.6-50 MG PO TABS
2.0000 | ORAL_TABLET | ORAL | Status: DC
  Administered 2017-10-04 (×2): 2 via ORAL
  Filled 2017-10-03 (×2): qty 2

## 2017-10-03 MED ORDER — ACETAMINOPHEN 325 MG PO TABS: 650.0000 mg | ORAL_TABLET | ORAL | Status: DC | PRN

## 2017-10-03 NOTE — Lactation Note (Addendum)
This note was copied from a baby's chart. Lactation Consultation Note  Patient Name: Adrienne Craig [id number] Date: 10/03/2017 Reason for consult: Follow-up assessment  Baby transferred back from NICU.  RN called to assist with helping baby to breastfeed.  Baby very fussy.   LC went in room, and baby being fed a bottle of formula.  Only 3 ml taken from bottle.  Baby fussy. Offered to assist with latch.  Hand expression revealed easy flow of colostrum.  Baby placed in cross cradle hold with assistance.  Baby able to open widely and with sandwiching of breast, baby able to attain a latch deeply onto breast.  Baby gave a couple sucks before popping off and crying. Tried in football hold and laid back cradle.  Initiated a 24 mm nipple shield to see if this would aide in transitioning from bottles.  Baby had 3 bottles in the NICU. Baby would not settle in and feed.  Reassured parents that baby had already taken in 28 ml of formula, and probably not hungry.  2 ml of colostrum given by curved tip syringe while baby sucking on Mom's finger.   Baby has significant moulding and swelling of head, and parents noted a "popping" in his shoulder.  This is upsetting Mom very much.  Baby very fussy when jostled, so placed baby STS on Mom's chest, lowered lights, and encouraged parents to talk softly to baby.  Baby calms with soft talking. Baby appears to be uncomfortable, RN aware.  Dr. Erik Obey to come up and examine baby.   Encouraged Mom to continue to pump when she can.  Baby needs her to keep him STS on her chest currently.  Maternal Data Formula Feeding for Exclusion: Yes Reason for exclusion: Admission to Intensive Care Unit (ICU) post-partum Has patient been taught Hand Expression?: Yes Does the patient have breastfeeding experience prior to this delivery?: No  Feeding Feeding Type: Breast Fed Nipple Type: Slow - flow Length of feed: 0 min  LATCH Score Latch: Repeated attempts needed to  sustain latch, nipple held in mouth throughout feeding, stimulation needed to elicit sucking reflex.  Audible Swallowing: None  Type of Nipple: Everted at rest and after stimulation  Comfort (Breast/Nipple): Soft / non-tender  Hold (Positioning): Full assist, staff holds infant at breast  LATCH Score: 5  Interventions Interventions: Assisted with latch;Skin to skin;Breast massage;Hand express;Breast compression;Adjust position;Support pillows;Position options;Expressed milk;DEBP  Lactation Tools Discussed/Used Tools: Pump;Nipple Dorris Carnes;Bottle Nipple shield size: 24 Breast pump type: Double-Electric Breast Pump WIC Program: No Pump Review: Setup, frequency, and cleaning;Milk Storage Initiated by:: HROB Date initiated:: 10/03/17   Consult Status Consult Status: Follow-up Date: 10/04/17 Follow-up type: In-patient    [name] 10/03/2017, 5:20 PM

## 2017-10-03 NOTE — Anesthesia Preprocedure Evaluation (Signed)
Anesthesia Evaluation  Patient identified by MRN, date of birth, ID band Patient awake    Reviewed: Allergy & Precautions, Patient's Chart, lab work & pertinent test results  Airway Mallampati: II       Dental no notable dental hx.    Pulmonary neg pulmonary ROS,    Pulmonary exam normal        Cardiovascular Normal cardiovascular exam     Neuro/Psych negative neurological ROS  negative psych ROS   GI/Hepatic negative GI ROS,   Endo/Other    Renal/GU      Musculoskeletal   Abdominal   Peds  Hematology   Anesthesia Other Findings   Reproductive/Obstetrics (+) Pregnancy                             Anesthesia Physical Anesthesia Plan  ASA: II  Anesthesia Plan: Epidural   Post-op Pain Management:    Induction:   PONV Risk Score and Plan:   Airway Management Planned:   Additional Equipment:   Intra-op Plan:   Post-operative Plan:   Informed Consent: I have reviewed the patients History and Physical, chart, labs and discussed the procedure including the risks, benefits and alternatives for the proposed anesthesia with the patient or authorized representative who has indicated his/her understanding and acceptance.     Plan Discussed with:   Anesthesia Plan Comments: (Lab Results      Component                Value               Date                      WBC                      12.8 (H)            10/02/2017                HGB                      12.6                10/02/2017                HCT                      36.4                10/02/2017                MCV                      94.5                10/02/2017                PLT                      179                 10/02/2017           )        Anesthesia Quick Evaluation

## 2017-10-03 NOTE — Progress Notes (Signed)
Labor Progress Note  Adrienne Craig is a 22 y.o. G1P0 at [redacted]w[redacted]d  admitted for induction of labor due to Low amniotic fluid..  S:   O:  BP 133/63   Pulse (!) 111   Temp 98.2 F (36.8 C) (Oral)   Resp 18   Ht  (1.575 m)   Wt 93.2 kg (205 lb 6.4 oz)   LMP 12/25/2016 (Approximate)   SpO2 99%   BMI 37.57 kg/m   Total I/O In: -  Out: 100 [Urine:100]  FHT:  FHR: 140 bpm, variability: moderate,  accelerations:  Present,  decelerations:  Absent UC:   Irregular, 1-4 SVE:   Dilation: Lip/rim Effacement (%): 100 Station: Plus 1 Exam by:: Marius Ditch, RN SROM: clear 1840   Labs: Lab Results  Component Value Date   WBC 12.8 (H) 10/02/2017   HGB 12.6 10/02/2017   HCT 36.4 10/02/2017   MCV 94.5 10/02/2017   PLT 179 10/02/2017    Assessment / Plan: 22 y.o. G1P0 [redacted]w[redacted]d in active labor Induction of labor due to oligo.  Labor: Progressing normally, will restart Pitocin. S/p terbutaline. Fetal Wellbeing:  Category I and Category II Pain Control:  IV pain meds Anticipated MOD:  NSVD  Expectant management   Caryl Ada, DO OB Fellow Center for [facility], [facility]

## 2017-10-03 NOTE — Anesthesia Postprocedure Evaluation (Signed)
Anesthesia Post Note  Patient: Adrienne Craig  Procedure(s) Performed: AN AD HOC LABOR EPIDURAL     Patient location during evaluation: Women's Unit Anesthesia Type: Epidural Level of consciousness: awake and alert Pain management: pain level controlled Vital Signs Assessment: post-procedure vital signs reviewed and stable Respiratory status: spontaneous breathing Cardiovascular status: blood pressure returned to baseline Postop Assessment: no backache, no headache, epidural receding, no apparent nausea or vomiting, patient able to bend at knees, adequate PO intake and able to ambulate Anesthetic complications: no    Last Vitals:  Vitals:   10/03/17 1133 10/03/17 1227  BP: 126/73 121/67  Pulse: 83 87  Resp: 18 18  Temp:  37.3 C  SpO2: 99% 99%    Last Pain:  Vitals:   10/03/17 1500  TempSrc:   PainSc: Asleep   Pain Goal: Patients Stated Pain Goal: 3 (10/03/17 1130)               Trever Streater

## 2017-10-03 NOTE — Anesthesia Procedure Notes (Signed)
Epidural Patient location during procedure: OB Start time: 10/03/2017 2:36 AM End time: 10/03/2017 2:44 AM  Staffing Anesthesiologist: Shelton Silvas, MD Performed: anesthesiologist   Preanesthetic Checklist Completed: patient identified, site marked, surgical consent, pre-op evaluation, timeout performed, IV checked, risks and benefits discussed and monitors and equipment checked  Epidural Patient position: sitting Prep: DuraPrep Patient monitoring: heart rate, continuous pulse ox and blood pressure Approach: midline Location: L3-L4 Injection technique: LOR saline  Needle:  Needle type: Tuohy  Needle gauge: 17 G Needle length: 9 cm Catheter type: closed end flexible Catheter size: 20 Guage Test dose: negative and 1.5% lidocaine  Assessment Events: blood not aspirated, injection not painful, no injection resistance and no paresthesia  Additional Notes LOR @ 6  Patient identified. Risks/Benefits/Options discussed with patient including but not limited to bleeding, infection, nerve damage, paralysis, failed block, incomplete pain control, headache, blood pressure changes, nausea, vomiting, reactions to medications, itching and postpartum back pain. Confirmed with bedside nurse the patient's most recent platelet count. Confirmed with patient that they are not currently taking any anticoagulation, have any bleeding history or any family history of bleeding disorders. Patient expressed understanding and wished to proceed. All questions were answered. Sterile technique was used throughout the entire procedure. Please see nursing notes for vital signs. Test dose was given through epidural catheter and negative prior to continuing to dose epidural or start infusion. Warning signs of high block given to the patient including shortness of breath, tingling/numbness in hands, complete motor block, or any concerning symptoms with instructions to call for help. Patient was given instructions on  fall risk and not to get out of bed. All questions and concerns addressed with instructions to call with any issues or inadequate analgesia.    Reason for block:procedure for pain

## 2017-10-03 NOTE — Progress Notes (Addendum)
Patient is complete with epidural. Pitocin restarted and now at 4 mu/min. IUPC and FSE have come out so external monitors on. Will attempt to push at this time as much as baby can tolerate. Some concern for large baby vs CPD. Category 2 tracing.   Caryl Ada, DO OB Fellow Center for [facility], [facility]

## 2017-10-03 NOTE — Lactation Note (Signed)
This note was copied from a baby's chart. Lactation Consultation Note  Patient Name: Adrienne Craig [id number]'[id number] Date: 10/03/2017 Reason for consult: Initial assessment;Primapara;1st time breastfeeding;NICU baby;Term  Visited with P1 Mom of NICU baby baby 5 hrs old.  Baby born at term, vaginally, shoulder dystocia, code apgar.  Baby sent to NICU.  Mom set up with DEBP by her RN.  Mom instructed to do breast massage and hand expression in addition to double pumping.  Mom in bed sleeping, eyes opening periodically.  Placed numbered dots in room as she is already expressing colostrum and transferred to NICU.  To send fax to [facility] for pump at discharge.  NICU and Lactation brochure given to Mom.  Mom will ask for help prn.   Consult Status Consult Status: Follow-up Date: 10/04/17 Follow-up type: In-patient    [name] 10/03/2017, 2:52 PM

## 2017-10-04 LAB — CBC
HEMATOCRIT: 30.2 % — AB (ref 36.0–46.0)
Hemoglobin: 10.2 g/dL — ABNORMAL LOW (ref 12.0–15.0)
MCH: 32.5 pg (ref 26.0–34.0)
MCHC: 33.8 g/dL (ref 30.0–36.0)
MCV: 96.2 fL (ref 78.0–100.0)
Platelets: 126 10*3/uL — ABNORMAL LOW (ref 150–400)
RBC: 3.14 MIL/uL — AB (ref 3.87–5.11)
RDW: 12.9 % (ref 11.5–15.5)
WBC: 14.5 10*3/uL — AB (ref 4.0–10.5)

## 2017-10-04 MED ORDER — MEASLES, MUMPS & RUBELLA VAC ~~LOC~~ INJ
0.5000 mL | INJECTION | Freq: Once | SUBCUTANEOUS | Status: AC
  Administered 2017-10-05: 0.5 mL via SUBCUTANEOUS
  Filled 2017-10-04 (×2): qty 0.5

## 2017-10-04 NOTE — Progress Notes (Signed)
Faculty Attending Note  Post Partum Day 1  Subjective: Patient is feeling well, she reports she is fine but she is worried about baby. Reports "popping" sound in infant's shoulder. She reports well controlled pain on PO pain meds. She is ambulating and reports very mild light-headedness. She is passing flatus. She is tolerating a regular diet without nausea/vomiting. Bleeding is moderate. She is breast & bottle feeding. Baby is in nursery and doing well, she was told they would do an xray this am.  Objective: Blood pressure 117/80, pulse 83, temperature 98 F (36.7 C), temperature source Oral, resp. rate 18, height 5' 2"  (1.575 m), weight 205 lb (93 kg), last menstrual period 12/25/2016, SpO2 100 %. Temp:  [97.8 F (36.6 C)-99.2 F (37.3 C)] 98 F (36.7 C) (05/11 0542) Pulse Rate:  [79-146] 83 (05/11 0542) Resp:  [17-18] 18 (05/11 0542) BP: (102-151)/(46-90) 117/80 (05/11 0542) SpO2:  [98 %-100 %] 100 % (05/11 0542) Weight:  [205 lb (93 kg)] 205 lb (93 kg) (05/10 1133)  Physical Exam:  General: alert, oriented, cooperative Chest: CTAB, normal respiratory effort Heart: RRR  Abdomen: soft, appropriately tender to palpation  Uterine Fundus: firm, 2 fingers below the umbilicus Lochia: moderate, rubra DVT Evaluation: no evidence of DVT Extremities: no edema, no calf tenderness   Current Facility-Administered Medications:  .  acetaminophen (TYLENOL) tablet 650 mg, 650 mg, Oral, Q4H PRN, Guadalupe Dawn, MD .  acetaminophen (TYLENOL) tablet 650 mg, 650 mg, Oral, Q4H PRN, Guadalupe Dawn, MD .  benzocaine-Menthol (DERMOPLAST) 20-0.5 % topical spray 1 application, 1 application, Topical, PRN, Guadalupe Dawn, MD .  coconut oil, 1 application, Topical, PRN, Guadalupe Dawn, MD .  witch hazel-glycerin (TUCKS) pad 1 application, 1 application, Topical, PRN **AND** dibucaine (NUPERCAINAL) 1 % rectal ointment 1 application, 1 application, Rectal, PRN, Guadalupe Dawn, MD .  diphenhydrAMINE  (BENADRYL) capsule 25 mg, 25 mg, Oral, Q6H PRN, Guadalupe Dawn, MD .  fentaNYL (SUBLIMAZE) injection 100 mcg, 100 mcg, Intravenous, Q1H PRN, Guadalupe Dawn, MD, 100 mcg at 10/02/17 2113 .  ibuprofen (ADVIL,MOTRIN) tablet 600 mg, 600 mg, Oral, Q6H, Guadalupe Dawn, MD, 600 mg at 10/04/17 0538 .  lactated ringers infusion 500-1,000 mL, 500-1,000 mL, Intravenous, PRN, Guadalupe Dawn, MD, Last Rate: 1,000 mL/hr at 10/02/17 2344, 500 mL at 10/02/17 2344 .  lactated ringers infusion, , Intravenous, Continuous, Guadalupe Dawn, MD, Stopped at 10/03/17 [phone] .  lidocaine (PF) (XYLOCAINE) 1 % injection 30 mL, 30 mL, Subcutaneous, PRN, Guadalupe Dawn, MD, 30 mL at 10/03/17 0920 .  measles, mumps and rubella vaccine (MMR) injection 0.5 mL, 0.5 mL, Subcutaneous, Once, Sloan Leiter, MD .  ondansetron [facility]) injection 4 mg, 4 mg, Intravenous, Q6H PRN, Guadalupe Dawn, MD .  ondansetron [facility]) tablet 4 mg, 4 mg, Oral, Q4H PRN **OR** ondansetron (ZOFRAN) injection 4 mg, 4 mg, Intravenous, Q4H PRN, Guadalupe Dawn, MD .  oxyCODONE-acetaminophen (PERCOCET/ROXICET) 5-325 MG per tablet 1 tablet, 1 tablet, Oral, Q4H PRN, Guadalupe Dawn, MD .  oxyCODONE-acetaminophen (PERCOCET/ROXICET) 5-325 MG per tablet 2 tablet, 2 tablet, Oral, Q4H PRN, Guadalupe Dawn, MD, 2 tablet at 10/03/17 [phone] .  oxytocin (PITOCIN) IV infusion 40 units in LR 1000 mL - Premix, 2.5 Units/hr, Intravenous, Continuous, Guadalupe Dawn, MD, Last Rate: 62.5 mL/hr at 10/03/17 0946, 2.5 Units/hr at 10/03/17 0946 .  prenatal multivitamin tablet 1 tablet, 1 tablet, Oral, Q1200, Guadalupe Dawn, MD, 1 tablet at 10/03/17 1211 .  senna-docusate (Senokot-S) tablet 2 tablet, 2 tablet, Oral, Q24H, Guadalupe Dawn, MD, 2 tablet  at 10/04/17 0537 .  simethicone (MYLICON) chewable tablet 80 mg, 80 mg, Oral, PRN, Guadalupe Dawn, MD .  sodium citrate-citric acid (ORACIT) solution 30 mL, 30 mL, Oral, Q2H PRN, Guadalupe Dawn, MD .  sodium phosphate (FLEET)  7-19 GM/118ML enema 1 enema, 1 enema, Rectal, PRN, Guadalupe Dawn, MD .  Tdap (BOOSTRIX) injection 0.5 mL, 0.5 mL, Intramuscular, Once, Guadalupe Dawn, MD .  zolpidem (AMBIEN) tablet 5 mg, 5 mg, Oral, QHS PRN, Guadalupe Dawn, MD Recent Labs    10/02/17 1049 10/04/17 0525  HGB 12.6 10.2*  HCT 36.4 30.2*    Assessment/Plan:  Patient is 22 y.o. G1P0 PPD#1 s/p SVD at 44w3dcomplicated by 762second shoulder dystocia. She is doing very well, recovering appropriately and complains only of mild pain. She is more concerned about infant's shoulder, was told infant is doing very well but because of popping sound, they may obtain xray. She will speak more with pediatrician today.    Continue routine post partum care Pain meds prn Regular diet declines birth control for now, I reviewed IUDs with her and [place]   KSloan Leiter5/03/2018, 7:17 AM

## 2017-10-05 MED ORDER — IBUPROFEN 600 MG PO TABS: 600.0000 mg | ORAL_TABLET | Freq: Four times a day (QID) | ORAL | 0 refills | Status: DC

## 2017-10-05 NOTE — Discharge Summary (Signed)
OB Discharge Summary  Patient Name: Adrienne Craig DOB: 1996-04-16 MRN: [id number]  Date of admission: 10/02/2017 Delivering MD: Sloan Leiter   Date of discharge: 10/05/2017  Admitting diagnosis: INDUCTION Intrauterine pregnancy: [redacted]w[redacted]d    Secondary diagnosis:Active Problems:   Oligohydramnios  Additional problems:Shoulder dystocia     Discharge diagnosis: Term Pregnancy Delivered                                                                     Post partum procedures:MMR  Augmentation: AROM, Pitocin and Cytotec  Complications: None shoulder dystocia  Hospital course:  Induction of Labor With Vaginal Delivery   22y.o. yo G1P0 at 476w4das admitted to the hospital 10/02/2017 for induction of labor.  Indication for induction: oligohydramnios.  Patient had an uncomplicated labor course as follows: Membrane Rupture Time/Date: 6:40 PM ,10/02/2017   Intrapartum Procedures: Episiotomy: None [1]                                         Lacerations:  1st degree [2];Perineal [11]  Patient had delivery of a Viable infant.  Information for the patient's newborn:  GoIfe, Vitelli0[[id number]]Delivery Method: Vag-Spont  70 s shoulder dystocia after pushing x 3 hours 10/03/2017  Details of delivery can be found in separate delivery note.  Patient had a routine postpartum course. Patient is discharged home 10/05/17.  Physical exam  Vitals:   10/04/17 1556 10/04/17 1930 10/04/17 2351 10/05/17 0356  BP: (!) 137/93 130/85 115/84 115/74  Pulse: 80 83 72 78  Resp: 16 18 18 18   Temp: 98.3 F (36.8 C) 98.9 F (37.2 C) 98.9 F (37.2 C) 98.6 F (37 C)  TempSrc: Oral Oral Oral Oral  SpO2: 98% 99% 98% 98%  Weight:      Height:       General: alert, cooperative and no distress Lochia: appropriate Uterine Fundus: firm DVT Evaluation: No evidence of DVT seen on physical exam. Labs: Lab Results  Component Value Date   WBC 14.5 (H) 10/04/2017   HGB 10.2 (L) 10/04/2017   HCT 30.2 (L) 10/04/2017   MCV 96.2 10/04/2017   PLT 126 (L) 10/04/2017   CMP Latest Ref Rng & Units 10/02/2017  Glucose 65 - 99 mg/dL 80  BUN 6 - 20 mg/dL 7  Creatinine 0.44 - 1.00 mg/dL 0.41(L)  Sodium 135 - 145 mmol/L 135  Potassium 3.5 - 5.1 mmol/L 4.0  Chloride 101 - 111 mmol/L 107  CO2 22 - 32 mmol/L 17(L)  Calcium 8.9 - 10.3 mg/dL 9.0  Total Protein 6.5 - 8.1 g/dL 6.6  Total Bilirubin 0.3 - 1.2 mg/dL 0.4  Alkaline Phos 38 - 126 U/L 136(H)  AST 15 - 41 U/L 18  ALT 14 - 54 U/L 12(L)    Discharge instruction: per After Visit Summary and "Baby and Me Booklet".  After Visit Meds:  Allergies as of 10/05/2017   No Active Allergies     Medication List    TAKE these medications   ibuprofen 600 MG tablet Commonly known as:  ADVIL,MOTRIN Take 1 tablet (600 mg total) by mouth  every 6 (six) hours.   prenatal vitamin w/FE, FA 27-1 MG Tabs tablet Take 1 tablet by mouth daily at 12 noon.       Diet: routine diet  Activity: Advance as tolerated. Pelvic rest for 6 weeks.   Outpatient follow up:4 wks Follow up Appt: Future Appointments  Date Time Provider [place]  11/13/2017  3:35 PM Starr Lake, CNM [facility] [place]   Follow up visit: No follow-ups on file.  Postpartum contraception: IUD Paragard  Newborn Data: Live born female  Birth Weight: 8 lb 1.1 oz (3660 g) APGAR: 2, 6  Newborn Delivery   Birth date/time:  10/03/2017 09:13:00 Delivery type:  Vaginal, Spontaneous     Baby Feeding: Bottle and Breast Disposition:home with mother  Please schedule this patient for Postpartum visit in: 4 weeks with the following provider: Any provider For C/S patients schedule nurse incision check in weeks 2 weeks: no Low risk pregnancy complicated by: none Delivery mode:  SVD Anticipated Birth Control:  IUD PP Procedures needed: none  Schedule Integrated BH visit: no 10/05/2017 Donnamae Jude, MD

## 2017-10-05 NOTE — Lactation Note (Addendum)
This note was copied from a baby's chart. Lactation Consultation Note  Patient Name: Adrienne Craig [id number] Date: 10/05/2017 Reason for consult: Follow-up assessment  Visited with P1 Mom on day of discharge, baby 48 hrs old and at 3.6% weight loss.  Mom has been alternating between breastfeeding and bottle feeding baby formula.  DEBP set up and Mom has been doing some pumping, not sure how much.  Breasts are filling today. Mom asking for guidance on positioning baby in football hold.  Tried with assist with latch, baby fussy and unable to settle into a deep latch.  Nipple erect and short shafted.   Initiated a 24 mm nipple shield.  Demonstrated how to apply, nipple pulled well into shield.  Colostrum easily expressed with hand expression.   Baby latched with assistance easily.  Mom taught to use alternate breast compression, multiple swallows identified.  Mom does not have a DEBP at home, does not have WIC.  Demonstrated how to assemble pump parts to make a manual double and single pump.  Recommended she pump after breastfeeding 4 times a day if possible.   Engorgement prevention and treatment discussed.     Encouraged keeping baby STS and breastfeeding on cue 8-12 times per 24 hrs.   Mom aware of OP Lactation appointments and referral sent to OP lactation for follow up this week. To call prn.  Feeding Feeding Type: Breast Fed  LATCH Score Latch: Grasps breast easily, tongue down, lips flanged, rhythmical sucking.  Audible Swallowing: Spontaneous and intermittent  Type of Nipple: Everted at rest and after stimulation  Comfort (Breast/Nipple): Filling, red/small blisters or bruises, mild/mod discomfort  Hold (Positioning): Assistance needed to correctly position infant at breast and maintain latch.  LATCH Score: 8  Interventions Interventions: Breast feeding basics reviewed;Assisted with latch;Skin to skin;Breast massage;Hand express;Breast compression;Adjust position;Support  pillows;Position options;Expressed milk;Coconut oil;Hand pump;DEBP  Lactation Tools Discussed/Used Tools: Pump;Nipple Dorris Carnes;Bottle Nipple shield size: 24 Breast pump type: Double-Electric Breast Pump;Manual WIC Program: No   Consult Status Consult Status: Complete Date: 10/08/17 Follow-up type: Out-patient    [name] 10/05/2017, 10:05 AM

## 2017-10-05 NOTE — Progress Notes (Signed)
Discharge instructions reviewed with patient.  Patient states understanding of home care for self and baby, medications, activity, signs/symptoms to report to MD and return MD office visit for both.  Patients significant other and family will assist with her care @ home.  No home  equipment needed, patient has prescriptions and all personal belongings.  Patient ambulated for discharge in stable condition with staff without incident.  Baby discharged home with mother.  

## 2017-10-05 NOTE — Discharge Instructions (Signed)
Vaginal Delivery, Care After °Refer to this sheet in the next few weeks. These instructions provide you with information about caring for yourself after vaginal delivery. Your health care provider may also give you more specific instructions. Your treatment has been planned according to current medical practices, but problems sometimes occur. Call your health care provider if you have any problems or questions. °What can I expect after the procedure? °After vaginal delivery, it is common to have: °· Some bleeding from your vagina. °· Soreness in your abdomen, your vagina, and the area of skin between your vaginal opening and your anus (perineum). °· Pelvic cramps. °· Fatigue. ° °Follow these instructions at home: °Medicines °· Take over-the-counter and prescription medicines only as told by your health care provider. °· If you were prescribed an antibiotic medicine, take it as told by your health care provider. Do not stop taking the antibiotic until it is finished. °Driving ° °· Do not drive or operate heavy machinery while taking prescription pain medicine. °· Do not drive for 24 hours if you received a sedative. °Lifestyle °· Do not drink alcohol. This is especially important if you are breastfeeding or taking medicine to relieve pain. °· Do not use tobacco products, including cigarettes, chewing tobacco, or e-cigarettes. If you need help quitting, ask your health care provider. °Eating and drinking °· Drink at least 8 eight-ounce glasses of water every day unless you are told not to by your health care provider. If you choose to breastfeed your baby, you may need to drink more water than this. °· Eat high-fiber foods every day. These foods may help prevent or relieve constipation. High-fiber foods include: °? Whole grain cereals and breads. °? Brown rice. °? Beans. °? Fresh fruits and vegetables. °Activity °· Return to your normal activities as told by your health care provider. Ask your health care provider  what activities are safe for you. °· Rest as much as possible. Try to rest or take a nap when your baby is sleeping. °· Do not lift anything that is heavier than your baby or 10 lb (4.5 kg) until your health care provider says that it is safe. °· Talk with your health care provider about when you can engage in sexual activity. This may depend on your: °? Risk of infection. °? Rate of healing. °? Comfort and desire to engage in sexual activity. °Vaginal Care °· If you have an episiotomy or a vaginal tear, check the area every day for signs of infection. Check for: °? More redness, swelling, or pain. °? More fluid or blood. °? Warmth. °? Pus or a bad smell. °· Do not use tampons or douches until your health care provider says this is safe. °· Watch for any blood clots that may pass from your vagina. These may look like clumps of dark red, brown, or black discharge. °General instructions °· Keep your perineum clean and dry as told by your health care provider. °· Wear loose, comfortable clothing. °· Wipe from front to back when you use the toilet. °· Ask your health care provider if you can shower or take a bath. If you had an episiotomy or a perineal tear during labor and delivery, your health care provider may tell you not to take baths for a certain length of time. °· Wear a bra that supports your breasts and fits you well. °· If possible, have someone help you with household activities and help care for your baby for at least a few days after   you leave the hospital.  Keep all follow-up visits for you and your baby as told by your health care provider. This is important. Contact a health care provider if:  You have: ? Vaginal discharge that has a bad smell. ? Difficulty urinating. ? Pain when urinating. ? A sudden increase or decrease in the frequency of your bowel movements. ? More redness, swelling, or pain around your episiotomy or vaginal tear. ? More fluid or blood coming from your episiotomy or  vaginal tear. ? Pus or a bad smell coming from your episiotomy or vaginal tear. ? A fever. ? A rash. ? Little or no interest in activities you used to enjoy. ? Questions about caring for yourself or your baby.  Your episiotomy or vaginal tear feels warm to the touch.  Your episiotomy or vaginal tear is separating or does not appear to be healing.  Your breasts are painful, hard, or turn red.  You feel unusually sad or worried.  You feel nauseous or you vomit.  You pass large blood clots from your vagina. If you pass a blood clot from your vagina, save it to show to your health care provider. Do not flush blood clots down the toilet without having your health care provider look at them.  You urinate more than usual.  You are dizzy or light-headed.  You have not breastfed at all and you have not had a menstrual period for 12 weeks after delivery.  You have stopped breastfeeding and you have not had a menstrual period for 12 weeks after you stopped breastfeeding. Get help right away if:  You have: ? Pain that does not go away or does not get better with medicine. ? Chest pain. ? Difficulty breathing. ? Blurred vision or spots in your vision. ? Thoughts about hurting yourself or your baby.  You develop pain in your abdomen or in one of your legs.  You develop a severe headache.  You faint.  You bleed from your vagina so much that you fill two sanitary pads in one hour. This information is not intended to replace advice given to you by your health care provider. Make sure you discuss any questions you have with your health care provider. Document Released: 05/10/2000 Document Revised: 10/25/2015 Document Reviewed: 05/28/2015 Elsevier Interactive Patient Education  2018 [employer]. Intrauterine Device Information An intrauterine device (IUD) is inserted into your uterus to prevent pregnancy. There are two types of IUDs available:  Copper IUD--This type of IUD is wrapped  in copper wire and is placed inside the uterus. Copper makes the uterus and fallopian tubes produce a fluid that kills sperm. The copper IUD can stay in place for 10 years.  Hormone IUD--This type of IUD contains the hormone progestin (synthetic progesterone). The hormone thickens the cervical mucus and prevents sperm from entering the uterus. It also thins the uterine lining to prevent implantation of a fertilized egg. The hormone can weaken or kill the sperm that get into the uterus. One type of hormone IUD can stay in place for 5 years, and another type can stay in place for 3 years.  Your health care provider will make sure you are a good candidate for a contraceptive IUD. Discuss with your health care provider the possible side effects. Advantages of an intrauterine device  IUDs are highly effective, reversible, long acting, and low maintenance.  There are no estrogen-related side effects.  An IUD can be used when breastfeeding.  IUDs are not associated with weight  gain.  The copper IUD works immediately after insertion.  The hormone IUD works right away if inserted within 7 days of your period starting. You will need to use a backup method of birth control for 7 days if the hormone IUD is inserted at any other time in your cycle.  The copper IUD does not interfere with your female hormones.  The hormone IUD can make heavy menstrual periods lighter and decrease cramping.  The hormone IUD can be used for 3 or 5 years.  The copper IUD can be used for 10 years. Disadvantages of an intrauterine device  The hormone IUD can be associated with irregular bleeding patterns.  The copper IUD can make your menstrual flow heavier and more painful.  You may experience cramping and vaginal bleeding after insertion. This information is not intended to replace advice given to you by your health care provider. Make sure you discuss any questions you have with your health care provider. Document  Released: 04/16/2004 Document Revised: 10/19/2015 Document Reviewed: 11/01/2012 Elsevier Interactive Patient Education  2017 [employer].

## 2017-10-07 ENCOUNTER — Ambulatory Visit: Payer: Self-pay

## 2017-10-07 NOTE — Lactation Note (Signed)
This note was copied from a baby's chart. 10/07/2017  Name: Adrienne Craig MRN: [id number] Date of Birth: 10/03/2017 Gestational Age: Gestational Age: [redacted]w[redacted]d Birth Weight: 129.1 oz Weight today:   7 pounds 9.7 ounces (3450 grams) with clean newborn diaper   Adrienne Craig has lost 80 grams since discharge home from the hospital on 5/12.   Adrienne Craig is here today with mom for feeding assessment. Mom reports BF is going better since she started using the NS. Mom reports her milk came in yesterday.   Infant is feeding every 3-4 hours for up to an hour. Mom has been counting feedings from the end of the feeding, enc mom to note feedings from the beginning of the feeding. Mom reports infant is sleepy at the breast. She is feeding STS and stimulating infant as needed. Mom reports she is using one breast for each feeding mainly. Stools are transitioning to green. Mom stopped formula when they went home from the hospital. Mom has hand expressed some and fed infant with a syringe x 2 of about 5 cc per mom.   Mom with filling breasts and everted nipples. She has positional stripes to the nipples that are healing per mom. Mom using coconut oil to nipples.   Mom has not been pumping as she forgot how to use her hand pump. She did get a Symphony pump from [facility] today. Enc mom to pump about 4 x a day post BF to protect milk supply. Milk should be fed to infant if he is still cueing to feed post BF.   Reviewed nipple shield use and the goal of weaning infant off the NS with time. Reviewed protecting milk supply wth pumping while using until it is determined infant is gaining well.   Infant with labial frenulum that inserts at the bottom of the gum ridge, upper lip blanches with flanging. Infant with posterior lingual frenulum with limited mid tongue elevation. Infant with good tongue extension and lateralization. Mom was shown oral structures and was given information on Tongue/Lip restrictions and local  providers. Mom to discuss with dad and pediatrician and decide if they want to have infant evaluated by the oral specialist.   Mom placed NS to the left breast and latched infant. Infant was very shallow and on and off the NS, showed mom how to obtain and maintain depth at the breast for better milk transfer. Infant with cheek dimpling at the breast, it did improve with feeding although it did not completely resolve. Infant drooled some with feeding. Lumpiness was noted to outer aspects of the breasts before feeding, they softened well with feeding. Infant transferred 38 ml. Infant burped and was relatched to the right breast without the NS. Mom reports pain with initial latch that improved with feeding. Infant fell asleep on the breast and was weighed and transferred 18 ml. Nipple was round post feeding.   Mom was given information on BF Support Groups. Infant to follow up with Pediatrician on Thursday 5/12. Mom has not heard from [facility] nurse. Mom would like to follow up with Lactation in 2 weeks.   Mom reports she is experiencing [employer]. She reports she has good family support at home. Enc mom to call OB if symptoms worsen or if she feels she needs assistance, mom voiced understanding.    Mom reports she has no further questions/concerns at this time. Mom to call as needed and to follow up with Lactation in 2 weeks.   General Information: Mother's reason  for visit: Feeding assessment, NS use Consult: Initial Lactation consultant: Noralee Stain RN,IBCLC Breastfeeding experience: going better with the use of the NS, milk is in   Maternal medications: Pre-natal vitamin, Motrin (ibuprofen)  Breastfeeding History: Frequency of breast feeding: every 3-4 hours Duration of feeding: 30 minutes-1 hours  Supplementation: Supplement method: syringe               Pump type: Symphony Pump frequency: 0, has not been pumping Pump volume: 0  Infant Output Assessment: Voids per 24  hours: 8+ Urine color: Clear yellow Stools per 24 hours: 4+ Stool color: Green  Breast Assessment: Breast: Filling Nipple: Erect Pain level: 2 Pain interventions: Bra, Coconut oil  Feeding Assessment: Infant oral assessment: Variance Infant oral assessment comment: Infant with labial frenulum that inserts at the bottom of the gum ridge, upper lip blanches with flanging. Infant with posterior lingual frenulum with limited mid tongue elevation. Infant with good tongue extension and lateralization. Positioning: Cross cradle(left breast) Latch: 2 - Grasps breast easily, tongue down, lips flanged, rhythmical sucking. Audible swallowing: 2 - Spontaneous and intermittent Type of nipple: 2 - Everted at rest and after stimulation Comfort: 1 - Filling, red/small blisters or bruises, mild/mod discomfort Hold: 1 - Assistance needed to correctly position infant at breast and maintain latch LATCH score: 8 Latch assessment: Deep Lips flanged: Yes Suck assessment: Displays both Tools: Nipple shield 24 mm Pre-feed weight: 3450 grams Post feed weight: 3488 grams Amount transferred: 38 ml Amount supplemented: 0  Additional Feeding Assessment: Infant oral assessment: Variance Infant oral assessment comment: see above Positioning: Cross cradle(right breast) Latch: 2 - Grasps breast easily, tongue down, lips flanged, rhythmical sucking. Audible swallowing: 2 - Spontaneous and intermittent Type of nipple: 2 - Everted at rest and after stimulation Comfort: 1 - Filling, red/small blisters or bruises, mild/mod discomfort Hold: 2 - No assistance needed to correctly position infant at breast LATCH score: 9 Latch assessment: Deep Lips flanged: Yes Suck assessment: Displays both   Pre-feed weight: 3488 grams Post feed weight: 3506 grams Amount transferred: 18 Amount supplemented: 0  Totals: Total amount transferred: 56 ml Total supplement given: 0 Total amount pumped post feed: 0    Plan:  1. Feed infant at the breast with feeding cues, infant should not go longer than 3 hours between feeds until he is gaining weight.  2. Use the # 24 nipple shield with feedings as needed, try each day without the nipple shield to see if infant can nurse without it 3. Stimulate infant as needed to keep him awake at the breast. 4.  Massage/compress breast with feedings to keep infant active at the breast  5. Empty one breast before offering second breast 6. Pumping 3-4 x a day post BF is recommended with nipple shield use to protect milk supply 7. Can offer syringe  of breast milk after breast feeding if infant is still cueing to feed 8. Infant needs about 64 ml-85 ml (2-3 ounces)  9. Free breast feeding apps Glow[employer]aby and Insurance risk surveyor 10. Keep up the good work !! 11. Call for questions/concerns as needed [phone] 12. Thank you for allowing me to assist you today.  13. Follow up with Lactation 2 weeks    Ed Blalock RN, [employer]  Debby Freiberg Baxter Gonzalez 19-Jan-2018, 11:15 AM

## 2017-10-09 ENCOUNTER — Encounter: Payer: Self-pay | Admitting: *Deleted

## 2017-10-14 ENCOUNTER — Encounter (HOSPITAL_COMMUNITY): Payer: Self-pay | Admitting: Emergency Medicine

## 2017-10-14 ENCOUNTER — Emergency Department (HOSPITAL_COMMUNITY)
Admission: EM | Admit: 2017-10-14 | Discharge: 2017-10-14 | Disposition: A | Discharge status: Home / Self Care | Payer: Medicaid Other | Attending: Emergency Medicine | Admitting: Emergency Medicine

## 2017-10-14 DIAGNOSIS — R1032 Left lower quadrant pain: Secondary | ICD-10-CM | POA: Insufficient documentation

## 2017-10-14 DIAGNOSIS — Z5321 Procedure and treatment not carried out due to patient leaving prior to being seen by health care provider: Secondary | ICD-10-CM | POA: Insufficient documentation

## 2017-10-14 HISTORY — DX: Calculus of kidney: N20.0

## 2017-10-14 LAB — CBC WITH DIFFERENTIAL/PLATELET
BASOS ABS: 0 10*3/uL (ref 0.0–0.1)
Basophils Relative: 0 %
EOS ABS: 0.2 10*3/uL (ref 0.0–0.7)
Eosinophils Relative: 2 %
HEMATOCRIT: 40.1 % (ref 36.0–46.0)
Hemoglobin: 13.8 g/dL (ref 12.0–15.0)
LYMPHS ABS: 4 10*3/uL (ref 0.7–4.0)
Lymphocytes Relative: 41 %
MCH: 31.2 pg (ref 26.0–34.0)
MCHC: 34.4 g/dL (ref 30.0–36.0)
MCV: 90.5 fL (ref 78.0–100.0)
MONOS PCT: 4 %
Monocytes Absolute: 0.4 10*3/uL (ref 0.1–1.0)
Neutro Abs: 5.2 10*3/uL (ref 1.7–7.7)
Neutrophils Relative %: 53 %
PLATELETS: 246 10*3/uL (ref 150–400)
RBC: 4.43 MIL/uL (ref 3.87–5.11)
RDW: 11.4 % — AB (ref 11.5–15.5)
WBC: 9.8 10*3/uL (ref 4.0–10.5)

## 2017-10-14 LAB — COMPREHENSIVE METABOLIC PANEL
ALBUMIN: 3.6 g/dL (ref 3.5–5.0)
ALT: 17 U/L (ref 14–54)
ANION GAP: 11 (ref 5–15)
AST: 21 U/L (ref 15–41)
Alkaline Phosphatase: 114 U/L (ref 38–126)
BILIRUBIN TOTAL: 0.4 mg/dL (ref 0.3–1.2)
BUN: 12 mg/dL (ref 6–20)
CHLORIDE: 107 mmol/L (ref 101–111)
CO2: 20 mmol/L — AB (ref 22–32)
Calcium: 9 mg/dL (ref 8.9–10.3)
Creatinine, Ser: 0.85 mg/dL (ref 0.44–1.00)
GFR calc Af Amer: >60 mL/min (ref >60)
GFR calc non Af Amer: >60 mL/min (ref >60)
GLUCOSE: 100 mg/dL — AB (ref 65–99)
POTASSIUM: 3.5 mmol/L (ref 3.5–5.1)
SODIUM: 138 mmol/L (ref 135–145)
Total Protein: 7.3 g/dL (ref 6.5–8.1)

## 2017-10-14 LAB — URINALYSIS, ROUTINE W REFLEX MICROSCOPIC
BILIRUBIN URINE: NEGATIVE
Glucose, UA: NEGATIVE mg/dL
KETONES UR: NEGATIVE mg/dL
Nitrite: NEGATIVE
PROTEIN: 30 mg/dL — AB
Specific Gravity, Urine: 1.017 (ref 1.005–1.030)
pH: 6 (ref 5.0–8.0)

## 2017-10-14 NOTE — ED Notes (Signed)
Pt states she no longer is willing to wait to be seen. LWBS after triage.

## 2017-10-14 NOTE — ED Triage Notes (Signed)
Patient reports LLQ pain similar to her kidney stone 2 years ago , denies hematuria , no fever or chills , recent child birth at [facility] 10/03/17.

## 2017-10-15 NOTE — ED Notes (Signed)
10/15/17     1015    Follow up call complete,  Left lwbs because of her new born child   s Jawann Urbani rn

## 2017-10-27 ENCOUNTER — Ambulatory Visit (HOSPITAL_COMMUNITY)
Admission: EM | Admit: 2017-10-27 | Discharge: 2017-10-27 | Disposition: A | Discharge status: Home / Self Care | Payer: Medicaid Other | Attending: Family Medicine | Admitting: Family Medicine

## 2017-10-27 ENCOUNTER — Other Ambulatory Visit: Payer: Self-pay

## 2017-10-27 ENCOUNTER — Encounter (HOSPITAL_COMMUNITY): Payer: Self-pay | Admitting: Emergency Medicine

## 2017-10-27 DIAGNOSIS — N760 Acute vaginitis: Secondary | ICD-10-CM | POA: Diagnosis not present

## 2017-10-27 DIAGNOSIS — Z3202 Encounter for pregnancy test, result negative: Secondary | ICD-10-CM | POA: Diagnosis not present

## 2017-10-27 DIAGNOSIS — R300 Dysuria: Secondary | ICD-10-CM

## 2017-10-27 DIAGNOSIS — N390 Urinary tract infection, site not specified: Secondary | ICD-10-CM | POA: Diagnosis present

## 2017-10-27 LAB — POCT URINALYSIS DIP (DEVICE)
Bilirubin Urine: NEGATIVE
Glucose, UA: NEGATIVE mg/dL
KETONES UR: NEGATIVE mg/dL
Nitrite: NEGATIVE
PH: 6 (ref 5.0–8.0)
PROTEIN: NEGATIVE mg/dL
SPECIFIC GRAVITY, URINE: 1.025 (ref 1.005–1.030)
UROBILINOGEN UA: 0.2 mg/dL (ref 0.0–1.0)

## 2017-10-27 LAB — POCT PREGNANCY, URINE: Preg Test, Ur: NEGATIVE

## 2017-10-27 NOTE — Discharge Instructions (Signed)
We are testing you for yeast, bacteria, stds and herpes. I have also sent for a urine culture to ensure this is not a UTI. We will call if any return positive. I was unable to order any witch hazel specifically, but use of the topical witch hazel pads may be helpful or using the liquid on a peri pad and then freezing it before application may be soothing.  Sitz bath. Please continue to follow up with oB as scheduled.

## 2017-10-27 NOTE — ED Notes (Signed)
Patient changing diaper

## 2017-10-27 NOTE — ED Provider Notes (Signed)
[facility]    CSN: [id number] Arrival date & time: 10/27/17  1418     History   Chief Complaint Chief Complaint  Patient presents with  . Urinary Tract Infection    HPI Adrienne Craig is a 22 y.o. female.   Adrienne presents with complaints of urethral irritation as well as pain with urination which started approximately 1 week ago. States at first felt like a kidney stone as she has had these in the past but this pain has resolved. Now has vulvar pain, even with sitting. Feels itchy and red. Denies any frequency. No fevers. Vaginal delivery 5/10 with first baby. States she had a urinary catheter. States her sensation to void has changed somewhat since catheter in place. Breast feeding. Still with vaginal bleeding.    ROS per HPI.      Past Medical History:  Diagnosis Date  . Kidney stones   . Medical history non-contributory     Patient Active Problem List   Diagnosis Date Noted  . Oligohydramnios 10/02/2017  . Rubella non-immune status, antepartum 04/07/2017  . Supervision of normal first pregnancy, antepartum 03/31/2017    Past Surgical History:  Procedure Laterality Date  . NO PAST SURGERIES      OB History    Gravida  1   Para      Term      Preterm      AB      Living        SAB      TAB      Ectopic      Multiple      Live Births               Home Medications    Prior to Admission medications   Medication Sig Start Date End Date Taking? Authorizing Provider  ibuprofen (ADVIL,MOTRIN) 600 MG tablet Take 1 tablet (600 mg total) by mouth every 6 (six) hours. 10/05/17   Reva Bores, MD  prenatal vitamin w/FE, FA (PRENATAL 1 + 1) 27-1 MG TABS tablet Take 1 tablet by mouth daily at 12 noon.    [provider]    Family History Family History  Problem Relation Age of Onset  . Diabetes Mother   . Arthritis Mother     Social History Social History   Tobacco Use  . Smoking status: Never Smoker  .  Smokeless tobacco: Never Used  Substance Use Topics  . Alcohol use: No  . Drug use: No     Allergies   Patient has no known allergies.   Review of Systems Review of Systems   Physical Exam Triage Vital Signs ED Triage Vitals  Enc Vitals Group     BP 10/27/17 1548 122/88     Pulse Rate 10/27/17 1548 77     Resp 10/27/17 1548 20     Temp --      Temp src --      SpO2 10/27/17 1548 98 %     Weight --      Height --      Head Circumference --      Peak Flow --      Pain Score 10/27/17 1519 5     Pain Loc --      Pain Edu? --      Excl. in GC? --    No data found.  Updated Vital Signs BP 122/88   Pulse 77   Resp 20   LMP 12/25/2016 (  Approximate)   SpO2 98%   Visual Acuity Right Eye Distance:   Left Eye Distance:   Bilateral Distance:    Right Eye Near:   Left Eye Near:    Bilateral Near:     Physical Exam  Constitutional: She is oriented to person, place, and time. She appears well-developed and well-nourished. No distress.  Cardiovascular: Normal rate, regular rhythm and normal heart sounds.  Pulmonary/Chest: Effort normal and breath sounds normal.  Abdominal: There is no tenderness.  Genitourinary:     Genitourinary Comments: External white lesions noted to proximal vulva; swabbed for hsv. Tender; no obvious drainage or discharge from the vagina, no swelling and no other tears noted; internal exam deferred due to recent vaginal delivery  Neurological: She is alert and oriented to person, place, and time.  Skin: Skin is warm and dry.     UC Treatments / Results  Labs (all labs ordered are listed, but only abnormal results are displayed) Labs Reviewed  POCT URINALYSIS DIP (DEVICE) - Abnormal; Notable for the following components:      Result Value   Hgb urine dipstick MODERATE (*)    Leukocytes, UA SMALL (*)    All other components within normal limits  URINE CULTURE  HSV CULTURE AND TYPING  POCT PREGNANCY, URINE  URINE CYTOLOGY ANCILLARY ONLY     EKG None  Radiology No results found.  Procedures Procedures (including critical care time)  Medications Ordered in UC Medications - No data to display  Initial Impression / Assessment and Plan / UC Course  I have reviewed the triage vital signs and the nursing notes.  Pertinent labs & imaging results that were available during my care of the patient were reviewed by me and considered in my medical decision making (see chart for details).     Small leuks to urine, sent for culture. Most of patient discomfort external, with lesions noted. Healing fissures vs hsv discussed. hsv swab collected, urine cytology also pending. Sitz baths recommended. Will notify of any positive findings and if any changes to treatment are needed. Has follow up ob appointment in the next month.  Patient verbalized understanding and agreeable to plan.    Final Clinical Impressions(s) / UC Diagnoses   Final diagnoses:  Vulvovaginitis     Discharge Instructions     We are testing you for yeast, bacteria, stds and herpes. I have also sent for a urine culture to ensure this is not a UTI. We will call if any return positive. I was unable to order any witch hazel specifically, but use of the topical witch hazel pads may be helpful or using the liquid on a peri pad and then freezing it before application may be soothing.  Sitz bath. Please continue to follow up with oB as scheduled.     ED Prescriptions    None     Controlled Substance Prescriptions [place] Controlled Substance Registry consulted? Not Applicable   Georgetta HaberBurky, Natalie B, NP 10/27/17 [phone]

## 2017-10-27 NOTE — ED Triage Notes (Signed)
Patient reports painful urination for a week and irritated skin around urethra

## 2017-10-28 ENCOUNTER — Ambulatory Visit: Payer: Self-pay

## 2017-10-28 LAB — URINE CYTOLOGY ANCILLARY ONLY
Chlamydia: NEGATIVE
Neisseria Gonorrhea: NEGATIVE
Trichomonas: NEGATIVE

## 2017-10-28 LAB — URINE CULTURE

## 2017-10-28 NOTE — Lactation Note (Signed)
This note was copied from a baby's chart.  10/28/2017  Name: Adrienne Craig MRN: [id number] Date of Birth: 10/03/2017 Gestational Age: Gestational Age: [redacted]w[redacted]d Birth Weight: 129.1 oz Weight today:    9 pounds 8.8 ounces (4332 grams) with clean size 1 diaper  Adrienne Craig has gained 882 grams in the last 20 days with an average daily weight gain of 44 grams a day.   Adrienne Craig presents today with mom for feeding assessment. Mom reports infant is BF better. She reports she has to use the shield with most feedings. She reports pain with some feedings. Infant slept for 5 hours last night. Mom is still awakening infant every 3 hours to feed, enc her to let infant feed on demand.   Mom reports infant was in the hospital for crying. She reports infant has been gassy and slept better last night.   Mom has decreased her pumping to 1-2 times a day and gets about 4 ounces each time. Infant is not being supplemented in the last week. Enc mom to continue pumping as long as she is using the nipple shield and then pumping can be weaned off unless infant is receiving a bottle.   Infant with thick labial frenulum that inserts at the bottom of the gum ridge, upper lip is tight with flanging bur more flexible than last week. Infant tongue mobility wnl. 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   Infant latched easily to the right breast in the cross cradle hold. Infant latched easily with the nipple shield. Infant with some clicking at the breast initially and stopped after breast softened more. Infant fed for about 20 minutes and transferred 102 ml. Mom denied pain with feeding and nipple was rounded after feeding.  Infant with small spit up after feeding burps. Infant did well feeding without the NS. Enc mom not to use unless she needs to, reviewed risks of using the NS long term.   Infant had large stool and was changed. Mom then relatched infant  to the left breast without the NS, infant fed well briefly and did well.   Enc mom to feed without the NS as she is able, plan is to wean it off as soon as possible. Enc mom to continue pumping until the NS is weaned off and then can wean off pumping.   Infant to follow up with Dr. Kennedy Bucker on June 14th. Mom aware of BF Support Groups. Mom to follow up with Lactation as needed. Mom reports she is feeling better emotionally today.   Mom reports all questions/concerns at this time.     General Information: Mother's reason for visit: follow up feeding assessment Consult: Follow-up Lactation consultant: Noralee Stain RN,IBCLC Breastfeeding experience: going better, latching with the NS, no supplement in the last week   Maternal medications: (Enc mom to take PNV while BF)  Breastfeeding History: Frequency of breast feeding: every 3 hours Duration of feeding: 30-1 hour  Supplementation:                 Pump type: Symphony Pump frequency: 1-2 x a day Pump volume: 4 ounces  Infant Output Assessment: Voids per 24 hours: 8+ Urine color: Clear yellow Stools per 24 hours: 4 Stool color: Yellow  Breast Assessment: Breast: Filling Nipple: Erect Pain level: 0 Pain interventions: Bra, Coconut oil  Feeding Assessment: Infant oral assessment: Variance Infant oral assessment comment: Infant with thick labial frenulum  that inserts at the bottom of the gum ridge, upper lip is tight with flanging bur more flexible than last week. Infant tongue mobility wnl. Positioning: Cross cradle(right breast) Latch: 2 - Grasps breast easily, tongue down, lips flanged, rhythmical sucking. Audible swallowing: 2 - Spontaneous and intermittent Type of nipple: 2 - Everted at rest and after stimulation Comfort: 2 - Soft/non-tender Hold: 2 - No assistance needed to correctly position infant at breast LATCH score: 10 Latch assessment: Deep Lips flanged: Yes Suck assessment: Displays both   Pre-feed  weight: 4332 grams Post feed weight: 4434 grams Amount transferred: 102 ml Amount supplemented: 0  Additional Feeding Assessment:                                    Totals: Total amount transferred: 102 ml Total supplement given: 0 Total amount pumped post feed: 0    Plan: 1. Feed infant at the breast with feeding cues 2. Use the # 24 nipple shield with feedings as needed, try each day without the nipple shield to see if infant can nurse without it 3. Stimulate infant as needed to keep him awake at the breast. 4. Massage/compress breast with feedings to keep infant active at the breast  5. Empty one breast before offering second breast 6. Pumping 3-4 x a day post BF is recommended with nipple shield use to protect milk supply 7. Can offer syringe of breast milk after breast feeding if infant is still cueing to feed or store if milk is not needed.  8. Infant needs about 81-108 ml (2.7-3.5 ounces) for 8 feedings a day or 645-860 ounces (22-29 ounces) a day 9. Free breast feeding apps Glowbaby and Insurance risk surveyorBaby Manager 10. Keep up the good work !! 11. Call for questions/concerns as needed [phone] 12. Thank you for allowing me to assist you today.  13. Follow up with Lactation as needed     Ed BlalockSharon S Daimian Sudberry RN, IBCLC                                                       Silas FloodSharon S [place]col[place] Freshour 10/28/2017, 2:16 PM

## 2017-10-29 LAB — HSV CULTURE AND TYPING

## 2017-10-30 ENCOUNTER — Telehealth (HOSPITAL_COMMUNITY): Payer: Self-pay

## 2017-10-30 MED ORDER — VALACYCLOVIR HCL 1 G PO TABS: 1000.0000 mg | ORAL_TABLET | Freq: Two times a day (BID) | ORAL | 0 refills | Status: AC

## 2017-10-30 NOTE — Telephone Encounter (Signed)
HSV is positive, patient called and made aware. Rx for Valtrex 1 gram BID x 7 days sent to pharmacy of choice. Patient is breast feeding and medication was okay per Linus MakoNatalie Burky FNP. Pt educated on herpes, safe sex practices and also encouraged to follow up with her OBGYN

## 2017-11-04 ENCOUNTER — Encounter: Payer: Self-pay | Admitting: Nurse Practitioner

## 2017-11-04 ENCOUNTER — Ambulatory Visit (INDEPENDENT_AMBULATORY_CARE_PROVIDER_SITE_OTHER): Payer: Medicaid Other | Admitting: Nurse Practitioner

## 2017-11-04 DIAGNOSIS — O09899 Supervision of other high risk pregnancies, unspecified trimester: Secondary | ICD-10-CM

## 2017-11-04 DIAGNOSIS — O9989 Other specified diseases and conditions complicating pregnancy, childbirth and the puerperium: Secondary | ICD-10-CM

## 2017-11-04 DIAGNOSIS — B009 Herpesviral infection, unspecified: Secondary | ICD-10-CM

## 2017-11-04 DIAGNOSIS — Z283 Underimmunization status: Secondary | ICD-10-CM

## 2017-11-04 DIAGNOSIS — Z1389 Encounter for screening for other disorder: Secondary | ICD-10-CM | POA: Diagnosis not present

## 2017-11-04 MED ORDER — PRENATAL PLUS 27-1 MG PO TABS: 1.0000 | ORAL_TABLET | Freq: Every day | ORAL | 11 refills | Status: AC

## 2017-11-04 NOTE — Progress Notes (Signed)
Post Partum Exam  Adrienne Craig is a 22 y.o. G1P0 female who presents for a postpartum visit. She is 4 weeks postpartum following a spontaneous vaginal delivery. I have fully reviewed the prenatal and intrapartum course. The delivery was at 40 gestational weeks.  Anesthesia: epidural. Postpartum course has been unremarkable. Baby's course has been unremarkable. Baby is feeding by breast. Bleeding staining only. Bowel function is normal. Bladder function is normal. Patient is not sexually active. Contraception method is none. Postpartum depression screening:neg Client was seen in the ER earlier this month.  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  Treated for initial outbreak of Type 1 HSV.  Discussed that infrequent outbreaks are expected.  Neither she nor her partner have noticed any fever blisters ever.  So she is confused as to how she contracted this infection.  The following portions of the patient's history were reviewed and updated as appropriate: allergies, current medications, past family history, past medical history, past social history, past surgical history and problem list. Last pap smear done 03-31-17 and was Normal  Review of Systems Pertinent items are noted in HPI.    Objective:  Blood pressure 117/72, pulse 75, weight 181 lb 8 oz (82.3 kg), last menstrual period 12/25/2016, currently breastfeeding.  General:  alert, cooperative and no distress   Breasts:  derferred - breastfeeding and denies any problems  Lungs: clear to auscultation bilaterally  Heart:  regular rate and rhythm, S1, S2 normal, no murmur, click, rub or gallop  Abdomen: derferred  Pelvic - deferred Assessment:    Normal postpartum exam. Pap smear due 03-2020.   Plan:   1. Contraception: condoms  Wants to use NFP but has irregular menses.  Did not know all the details of NFP so gave written info and discussed with client. Was unaware she could ovulate prior to menses starting. 2. Follow up  in: 1 year or as needed.  3.  HSV 1 - - discussed measures to avoid baby getting HSV 1 - not likely due to area being covered - genital.  Recommended washing hands after going to the bathroom. 4.  Very best health - Needs to continue to decrease weight to 135. 5.  Advised no pregnancy for 18 months and some condoms given.  Nolene BernheimERRI Anthonny Schiller, RN, MSN, NP-BC Nurse Practitioner, [facility], [facility] 11/04/2017 6:39 PM

## 2017-11-04 NOTE — Patient Instructions (Addendum)
Type 1 herpes - look at [facility] website   Natural Family Planning Introduction Natural Family Planning (NFP) is a type of birth control without using any form of contraception. Women who use NFP should not have sexual intercourse when the ovary produces an egg (ovulation) during the menstrual cycle. The NFP method is safe and can prevent pregnancy. It is 75% effective when practiced right. The man needs to also understand this method of birth control and the woman needs to be aware of how her body functions during her menstrual cycle. NFP can also be used as a method of getting pregnant. HOW THE NFP METHOD WORKS  A woman's menstrual period usually happens every 28-30 days (it can vary from 23-35 days).  Ovulation happens 12-14 days before the start of the next menstrual period (the fertile period). The egg is fertile for 24 hours and the sperm can live for 3 days or more. If there is sexual intercourse at this time, pregnancy can occur. THERE ARE MANY TYPES OF NFP METHODS USED TO PREVENT PREGNANCY  The basal body temperature method. Often times, there is a slight increase of body temperature when a woman ovulates. Take your temperature every morning before getting out of bed. Write the temperature on a chart. An increase in the temperature shows ovulation has happened. Do not have sexual intercourse from the menstrual period up to three days after the increase in the temperature. Note that the body temperature may increase as a result of fever, restless sleep, and working schedules.  The ovulation cervical mucus method. During the menstrual cycle, the cervical mucus changes from dry and sticky to wet and slippery. Check the mucus of the vagina every day to look for these changes. Just before ovulation, the mucus becomes wet and slippery. On the last day of wetness, ovulation happens. To avoid getting pregnant, sexual intercourse is safe for about 10 days after the menstrual period and on the dry mucus  days. Do not have sexual intercourse when the mucus starts to show up and not until 4 days after the wet and slippery mucus goes away. Sexual intercourse after the 4 days have passed until the menstrual period starts is a safe time. Note that the mucus from the vagina can increase because of a vaginal or cervical infection, lubricants, some medicines, and sexual excitement.  The symptothermal method. This method uses both the temperature and the ovulation methods. Combine the two methods above to prevent pregnancy.  The calendar method. Record your menstrual periods and length of the cycles for 6 months. This is helpful when the menstrual cycle varies in the length of the cycle. The length of a menstrual cycle is from day 1 of the present menstrual period to day 1 of the next menstrual period. Then, find your fertile days of the month and do not have sexual intercourse during that time. You may need help from your health care provider to find out your fertile days. There are some signs of ovulation that may be helpful when trying to find the time of ovulation. This includes vaginal spotting or abdominal cramps during the middle of your menstrual cycle. Not all women have these symptoms. YOU SHOULD NOT USE NFP IF:  You have very irregular menstrual periods and may skip months.  You have abnormal bleeding.  You have a vaginal or cervical infection.  You are on medicines that can affect the vaginal mucus or body temperature. These medicines include antibiotics, thyroid medicines, and antihistamines (cold and allergy  medicine). This information is not intended to replace advice given to you by your health care provider. Make sure you discuss any questions you have with your health care provider. Document Released: 10/30/2007 Document Revised: 10/19/2015 Document Reviewed: 11/13/2012 Elsevier Interactive Patient Education  2017 [employer].

## 2017-11-13 ENCOUNTER — Ambulatory Visit: Payer: Medicaid Other | Admitting: Student

## 2017-11-19 ENCOUNTER — Encounter (HOSPITAL_COMMUNITY): Payer: Self-pay

## 2017-11-19 ENCOUNTER — Ambulatory Visit (HOSPITAL_COMMUNITY)
Admission: EM | Admit: 2017-11-19 | Discharge: 2017-11-19 | Disposition: A | Discharge status: Home / Self Care | Payer: Medicaid Other | Attending: Family Medicine | Admitting: Family Medicine

## 2017-11-19 DIAGNOSIS — J020 Streptococcal pharyngitis: Secondary | ICD-10-CM | POA: Diagnosis not present

## 2017-11-19 LAB — POCT RAPID STREP A: Streptococcus, Group A Screen (Direct): POSITIVE — AB

## 2017-11-19 MED ORDER — IBUPROFEN 600 MG PO TABS: 600.0000 mg | ORAL_TABLET | Freq: Four times a day (QID) | ORAL | 0 refills | Status: DC

## 2017-11-19 MED ORDER — AMOXICILLIN 875 MG PO TABS: 875.0000 mg | ORAL_TABLET | Freq: Two times a day (BID) | ORAL | 0 refills | Status: AC

## 2017-11-19 NOTE — ED Triage Notes (Signed)
pt presents with sore throat and fever.

## 2017-11-19 NOTE — ED Provider Notes (Signed)
[facility]   [id number] 11/19/17 Arrival Time: 1358  ASSESSMENT & PLAN:  1. Streptococcal sore throat     Meds ordered this encounter  Medications  . ibuprofen (ADVIL,MOTRIN) 600 MG tablet    Sig: Take 1 tablet (600 mg total) by mouth every 6 (six) hours.    Dispense:  30 tablet    Refill:  0  . amoxicillin (AMOXIL) 875 MG tablet    Sig: Take 1 tablet (875 mg total) by mouth 2 (two) times daily for 10 days.    Dispense:  20 tablet    Refill:  0   Labs Reviewed  POCT RAPID STREP A - Abnormal; Notable for the following components:      Result Value   Streptococcus, Group A Screen (Direct) POSITIVE (*)    All other components within normal limits   OTC analgesics and throat care as needed  Instructed to finish full 10 day course of antibiotics. Will follow up if not showing significant improvement over the next 24-48 hours.  Reviewed expectations re: course of current medical issues. Questions answered. Outlined signs and symptoms indicating need for more acute intervention. Patient verbalized understanding. After Visit Summary given.   SUBJECTIVE:  Adrienne Craig is a 22 y.o. female who reports a sore throat. Describes as pain with swallowing. Onset abrupt beginning a few days ago. No respiratory symptoms. Normal PO intake but reports discomfort with swallowing. Fever reported: yes. No associated n/v/abdominal symptoms. Sick contacts: none.  OTC treatment: none.  ROS: As per HPI.   OBJECTIVE:  Vitals:   11/19/17 1450  BP: 124/79  Pulse: (!) 113  Resp: 18  Temp: (!) 102.9 F (39.4 C)  SpO2: 100%     General appearance: alert; no distress HEENT: throat with marked erythema with tonsil enlargement; some exudate; uvula midline yes Neck: supple with FROM; small cervical LAD Lungs: clear to auscultation bilaterally Skin: reveals no rash; warm and dry Psychological: alert and cooperative; normal mood and affect  No Known Allergies  Past Medical  History:  Diagnosis Date  . Kidney stones   . Medical history non-contributory    Social History   Socioeconomic History  . Marital status: Single    Spouse name: Not on file  . Number of children: Not on file  . Years of education: Not on file  . Highest education level: Not on file  Occupational History  . Not on file  Social Needs  . Financial resource strain: Not on file  . Food insecurity:    Worry: Not on file    Inability: Not on file  . Transportation needs:    Medical: Not on file    Non-medical: Not on file  Tobacco Use  . Smoking status: Never Smoker  . Smokeless tobacco: Never Used  Substance and Sexual Activity  . Alcohol use: No  . Drug use: No  . Sexual activity: Yes    Birth control/protection: None  Lifestyle  . Physical activity:    Days per week: Not on file    Minutes per session: Not on file  . Stress: Not on file  Relationships  . Social connections:    Talks on phone: Not on file    Gets together: Not on file    Attends religious service: Not on file    Active member of club or organization: Not on file    Attends meetings of clubs or organizations: Not on file    Relationship status: Not on file  .  Intimate partner violence:    Fear of current or ex partner: Not on file    Emotionally abused: Not on file    Physically abused: Not on file    Forced sexual activity: Not on file  Other Topics Concern  . Not on file  Social History Narrative  . Not on file   Family History  Problem Relation Age of Onset  . Diabetes Mother   . Arthritis Mother           Mardella Layman, MD 11/19/17 1520

## 2018-04-17 ENCOUNTER — Inpatient Hospital Stay (HOSPITAL_COMMUNITY): Payer: Medicaid Other

## 2018-04-17 ENCOUNTER — Inpatient Hospital Stay (HOSPITAL_COMMUNITY)
Admission: AD | Admit: 2018-04-17 | Discharge: 2018-04-17 | Disposition: A | Discharge status: Home / Self Care | Payer: Medicaid Other | Source: Ambulatory Visit | Attending: Obstetrics & Gynecology | Admitting: Obstetrics & Gynecology

## 2018-04-17 ENCOUNTER — Other Ambulatory Visit: Payer: Self-pay

## 2018-04-17 ENCOUNTER — Encounter (HOSPITAL_COMMUNITY): Payer: Self-pay | Admitting: *Deleted

## 2018-04-17 DIAGNOSIS — O26891 Other specified pregnancy related conditions, first trimester: Secondary | ICD-10-CM

## 2018-04-17 DIAGNOSIS — Z79899 Other long term (current) drug therapy: Secondary | ICD-10-CM | POA: Insufficient documentation

## 2018-04-17 DIAGNOSIS — Z833 Family history of diabetes mellitus: Secondary | ICD-10-CM | POA: Diagnosis not present

## 2018-04-17 DIAGNOSIS — N760 Acute vaginitis: Secondary | ICD-10-CM | POA: Diagnosis not present

## 2018-04-17 DIAGNOSIS — R109 Unspecified abdominal pain: Secondary | ICD-10-CM | POA: Diagnosis not present

## 2018-04-17 DIAGNOSIS — O3680X0 Pregnancy with inconclusive fetal viability, not applicable or unspecified: Secondary | ICD-10-CM | POA: Diagnosis not present

## 2018-04-17 DIAGNOSIS — Z3A01 Less than 8 weeks gestation of pregnancy: Secondary | ICD-10-CM | POA: Insufficient documentation

## 2018-04-17 DIAGNOSIS — O209 Hemorrhage in early pregnancy, unspecified: Secondary | ICD-10-CM | POA: Insufficient documentation

## 2018-04-17 DIAGNOSIS — Z791 Long term (current) use of non-steroidal anti-inflammatories (NSAID): Secondary | ICD-10-CM | POA: Insufficient documentation

## 2018-04-17 DIAGNOSIS — B9689 Other specified bacterial agents as the cause of diseases classified elsewhere: Secondary | ICD-10-CM | POA: Diagnosis not present

## 2018-04-17 HISTORY — DX: Major depressive disorder, single episode, unspecified: F32.9

## 2018-04-17 HISTORY — DX: Depression, unspecified: F32.A

## 2018-04-17 LAB — CBC
HCT: 41 % (ref 36.0–46.0)
Hemoglobin: 14.1 g/dL (ref 12.0–15.0)
MCH: 32 pg (ref 26.0–34.0)
MCHC: 34.4 g/dL (ref 30.0–36.0)
MCV: 93 fL (ref 80.0–100.0)
NRBC: 0 % (ref 0.0–0.2)
PLATELETS: 218 10*3/uL (ref 150–400)
RBC: 4.41 MIL/uL (ref 3.87–5.11)
RDW: 12.1 % (ref 11.5–15.5)
WBC: 8.9 10*3/uL (ref 4.0–10.5)

## 2018-04-17 LAB — URINALYSIS, ROUTINE W REFLEX MICROSCOPIC
BACTERIA UA: NONE SEEN
Bilirubin Urine: NEGATIVE
GLUCOSE, UA: NEGATIVE mg/dL
Hgb urine dipstick: NEGATIVE
KETONES UR: NEGATIVE mg/dL
Nitrite: NEGATIVE
PROTEIN: NEGATIVE mg/dL
Specific Gravity, Urine: 1.019 (ref 1.005–1.030)
pH: 5 (ref 5.0–8.0)

## 2018-04-17 LAB — WET PREP, GENITAL
Sperm: NONE SEEN
Trich, Wet Prep: NONE SEEN
Yeast Wet Prep HPF POC: NONE SEEN

## 2018-04-17 LAB — POCT PREGNANCY, URINE: PREG TEST UR: POSITIVE — AB

## 2018-04-17 LAB — HCG, QUANTITATIVE, PREGNANCY: HCG, BETA CHAIN, QUANT, S: 68 m[IU]/mL — AB (ref <5)

## 2018-04-17 MED ORDER — METRONIDAZOLE 500 MG PO TABS: 500.0000 mg | ORAL_TABLET | Freq: Two times a day (BID) | ORAL | 0 refills | Status: AC

## 2018-04-17 NOTE — MAU Note (Signed)
Missed her period. Is breast feeding her 406 month old.  Neg UPT a few days ago, faint + today.  Has been having these really bad cramps. Had some light bleeding off and on this week

## 2018-04-17 NOTE — Discharge Instructions (Signed)
Your pregnancy hormone level is 68. It needs to be at least 1,500 to 2,000 in order for the pregnancy to be seen on ultrasound.

## 2018-04-17 NOTE — MAU Provider Note (Signed)
History     CSN: [id number]  Arrival date and time: 04/17/18 1218   First Provider Initiated Contact with Patient 04/17/18 1353      Chief Complaint  Patient presents with  . Abdominal Pain  . Possible Pregnancy   HPI  [name] is a 22 y.o. year old G54P1001 female at [redacted]w[redacted]d weeks gestation who presents to MAU reporting missed period (breastfeeding for 6 months), Neg HPT a few days ago and a "faint" Pos HPT today. She reports having unprotected sex the beginning of this month. She took a Plan B pill on either November 3rd or 4th, had unprotected sex on November 5th, because she "thought it stayed in system for 5 days," and she took Plan B again on 11/22. She reports "really bad abdominal cramps and light VB" off and on x 1 wk. She feels like she might be pregnant, but not sure.  Past Medical History:  Diagnosis Date  . Depression    mild PP, doing ok now  . Kidney stones     Past Surgical History:  Procedure Laterality Date  . NO PAST SURGERIES      Family History  Problem Relation Age of Onset  . Diabetes Mother   . Arthritis Mother   . Heart disease Mother   . Stroke Mother   . Asthma Mother   . Healthy Father     Social History   Tobacco Use  . Smoking status: Never Smoker  . Smokeless tobacco: Never Used  Substance Use Topics  . Alcohol use: No  . Drug use: No    Allergies: No Known Allergies  Medications Prior to Admission  Medication Sig Dispense Refill Last Dose  . ibuprofen (ADVIL,MOTRIN) 600 MG tablet Take 1 tablet (600 mg total) by mouth every 6 (six) hours. 30 tablet 0   . prenatal vitamin w/FE, FA (PRENATAL 1 + 1) 27-1 MG TABS tablet Take 1 tablet by mouth daily at 12 noon. 30 tablet 11     Review of Systems  Constitutional: Negative.   HENT: Negative.   Eyes: Negative.   Respiratory: Negative.   Cardiovascular: Negative.   Gastrointestinal: Negative.   Endocrine: Negative.   Genitourinary: Positive for pelvic pain (cramping  off and on x 1 week) and vaginal bleeding (spotting off and on x 1 week).  Musculoskeletal: Negative.   Skin: Negative.   Allergic/Immunologic: Negative.   Neurological: Negative.   Hematological: Negative.   Psychiatric/Behavioral: Negative.    Physical Exam   Blood pressure 116/65, pulse 82, temperature 98.7 F (37.1 C), temperature source Oral, resp. rate 16, weight 78.8 kg, last menstrual period 03/10/2018, SpO2 98 %, currently breastfeeding.  Physical Exam  Nursing note and vitals reviewed. Constitutional: She is oriented to person, place, and time. She appears well-developed and well-nourished.  HENT:  Head: Normocephalic and atraumatic.  Eyes: Pupils are equal, round, and reactive to light.  Neck: Normal range of motion.  Cardiovascular: Normal rate.  Respiratory: Effort normal.  GI: Soft.  Genitourinary:  Genitourinary Comments: Uterus: non-tender, SE: cervix is smooth, pink, no lesions, small amt of thick, white vaginal d/c -- WP, GC/CT done, closed/long/firm, no CMT or friability, no adnexal tenderness   Musculoskeletal: Normal range of motion.  Neurological: She is alert and oriented to person, place, and time.  Skin: Skin is warm and dry.  Psychiatric: She has a normal mood and affect. Her behavior is normal. Judgment and thought content normal.    MAU Course  Procedures  MDM CC[facility] UPT CBC ABO/Rh -- not redrawn; on file  HCG Wet Prep GC/CT -- pending HIV -- pending OB < 14 wks [place] with TV  Results for orders placed or performed during the hospital encounter of 04/17/18 (from the past 24 hour(s))  Urinalysis, Routine w reflex microscopic     Status: Abnormal   Collection Time: 04/17/18 12:54 PM  Result Value Ref Range   Color, Urine YELLOW YELLOW   APPearance HAZY (A) CLEAR   Specific Gravity, Urine 1.019 1.005 - 1.030   pH 5.0 5.0 - 8.0   Glucose, UA NEGATIVE NEGATIVE mg/dL   Hgb urine dipstick NEGATIVE NEGATIVE   Bilirubin Urine NEGATIVE NEGATIVE    Ketones, ur NEGATIVE NEGATIVE mg/dL   Protein, ur NEGATIVE NEGATIVE mg/dL   Nitrite NEGATIVE NEGATIVE   Leukocytes, UA SMALL (A) NEGATIVE   RBC / HPF 0-5 0 - 5 RBC/hpf   WBC, UA 0-5 0 - 5 WBC/hpf   Bacteria, UA NONE SEEN NONE SEEN   Squamous Epithelial / LPF 6-10 0 - 5   Mucus PRESENT   Pregnancy, urine POC     Status: Abnormal   Collection Time: 04/17/18 12:56 PM  Result Value Ref Range   Preg Test, Ur POSITIVE (A) NEGATIVE  CBC     Status: None   Collection Time: 04/17/18  2:05 PM  Result Value Ref Range   WBC 8.9 4.0 - 10.5 K/uL   RBC 4.41 3.87 - 5.11 MIL/uL   Hemoglobin 14.1 12.0 - 15.0 g/dL   HCT 16.1 09.6 - 04.5 %   MCV 93.0 80.0 - 100.0 fL   MCH 32.0 26.0 - 34.0 pg   MCHC 34.4 30.0 - 36.0 g/dL   RDW 40.9 81.1 - 91.4 %   Platelets 218 150 - 400 K/uL   nRBC 0.0 0.0 - 0.2 %  hCG, quantitative, pregnancy     Status: Abnormal   Collection Time: 04/17/18  2:05 PM  Result Value Ref Range   hCG, Beta Chain, Quant, S 68 (H) <5 mIU/mL  Wet prep, genital     Status: Abnormal   Collection Time: 04/17/18  2:14 PM  Result Value Ref Range   Yeast Wet Prep HPF POC NONE SEEN NONE SEEN   Trich, Wet Prep NONE SEEN NONE SEEN   Clue Cells Wet Prep HPF POC PRESENT (A) NONE SEEN   WBC, Wet Prep HPF POC MANY (A) NONE SEEN   Sperm NONE SEEN     US Ob Less Than 14 Weeks With Ob Transvaginal  Result Date: 04/17/2018 CLINICAL DATA:  Pelvic pain and vaginal bleeding. Gestational age by LMP of 5 weeks 3 days. EXAM: OBSTETRIC <14 WK [place] AND TRANSVAGINAL OB US TECHNIQUE: Both transabdominal and transvaginal ultrasound examinations were performed for complete evaluation of the gestation as well as the maternal uterus, adnexal regions, and pelvic cul-de-sac. Transvaginal technique was performed to assess early pregnancy. COMPARISON:  None. FINDINGS: Intrauterine gestational sac: None Maternal uterus/adnexae: Endometrial thickness measures 16 mm. Both ovaries are normal appearance. No mass or abnormal  free fluid identified. IMPRESSION: Pregnancy of unknown anatomic location (no intrauterine gestational sac or adnexal mass identified). Differential diagnosis includes recent spontaneous abortion, IUP too early to visualize, and non-visualized ectopic pregnancy. Recommend correlation with serial beta-hCG levels, and follow up US if warranted clinically. Electronically Signed   By: Myles Rosenthal M.D.   On: 04/17/2018 15:26    Assessment and Plan  Bacterial vaginitis  - Rx for Flagyl 500 mg BID  x 7 days sent - Information provided on BV   Pregnancy of unknown anatomic location - Information provided on ectopic pregnancy and threatened miscarriage  - Return to MAU on Sunday 04/19/2018 after 1100 for rpt HCG - Advised to return sooner for abd pain not relieved with Tylenol and/or increased VB  - Discharge patient - Patient verbalized an understanding of the plan of care and agrees.    Raelyn Moraolitta Inanna Telford, MSN, CNM 04/17/2018, 1:53 PM

## 2018-04-18 LAB — HIV ANTIBODY (ROUTINE TESTING W REFLEX): HIV SCREEN 4TH GENERATION: NONREACTIVE

## 2018-04-20 LAB — GC/CHLAMYDIA PROBE AMP (~~LOC~~) NOT AT ARMC
CHLAMYDIA, DNA PROBE: NEGATIVE
NEISSERIA GONORRHEA: NEGATIVE

## 2018-04-29 ENCOUNTER — Inpatient Hospital Stay (HOSPITAL_COMMUNITY)
Admission: AD | Admit: 2018-04-29 | Discharge: 2018-04-29 | Disposition: A | Discharge status: Home / Self Care | Payer: Medicaid Other | Source: Ambulatory Visit | Attending: Obstetrics and Gynecology | Admitting: Obstetrics and Gynecology

## 2018-04-29 ENCOUNTER — Encounter (HOSPITAL_COMMUNITY): Payer: Self-pay | Admitting: *Deleted

## 2018-04-29 ENCOUNTER — Other Ambulatory Visit: Payer: Self-pay

## 2018-04-29 ENCOUNTER — Inpatient Hospital Stay (HOSPITAL_COMMUNITY): Payer: Medicaid Other

## 2018-04-29 DIAGNOSIS — Z3A01 Less than 8 weeks gestation of pregnancy: Secondary | ICD-10-CM | POA: Diagnosis not present

## 2018-04-29 DIAGNOSIS — O0281 Inappropriate change in quantitative human chorionic gonadotropin (hCG) in early pregnancy: Secondary | ICD-10-CM | POA: Diagnosis not present

## 2018-04-29 DIAGNOSIS — O3680X0 Pregnancy with inconclusive fetal viability, not applicable or unspecified: Secondary | ICD-10-CM | POA: Diagnosis not present

## 2018-04-29 DIAGNOSIS — O209 Hemorrhage in early pregnancy, unspecified: Secondary | ICD-10-CM | POA: Diagnosis present

## 2018-04-29 LAB — URINALYSIS, ROUTINE W REFLEX MICROSCOPIC
BACTERIA UA: NONE SEEN
Bilirubin Urine: NEGATIVE
GLUCOSE, UA: NEGATIVE mg/dL
KETONES UR: NEGATIVE mg/dL
NITRITE: NEGATIVE
PROTEIN: NEGATIVE mg/dL
Specific Gravity, Urine: 1.019 (ref 1.005–1.030)
pH: 5 (ref 5.0–8.0)

## 2018-04-29 LAB — CBC
HCT: 41.7 % (ref 36.0–46.0)
HEMOGLOBIN: 14.3 g/dL (ref 12.0–15.0)
MCH: 32.3 pg (ref 26.0–34.0)
MCHC: 34.3 g/dL (ref 30.0–36.0)
MCV: 94.1 fL (ref 80.0–100.0)
Platelets: 228 10*3/uL (ref 150–400)
RBC: 4.43 MIL/uL (ref 3.87–5.11)
RDW: 12.1 % (ref 11.5–15.5)
WBC: 8.7 10*3/uL (ref 4.0–10.5)
nRBC: 0 % (ref 0.0–0.2)

## 2018-04-29 LAB — HCG, QUANTITATIVE, PREGNANCY: HCG, BETA CHAIN, QUANT, S: 131 m[IU]/mL — AB (ref <5)

## 2018-04-29 NOTE — Discharge Instructions (Signed)
Ectopic Pregnancy An ectopic pregnancy is when the fertilized egg attaches (implants) outside the uterus. Most ectopic pregnancies occur in one of the tubes where eggs travel from the ovary to the uterus (fallopian tubes), but the implanting can occur in other locations. In rare cases, ectopic pregnancies occur on the ovary, intestine, pelvis, abdomen, or cervix. In an ectopic pregnancy, the fertilized egg does not have the ability to develop into a normal, healthy baby. A ruptured ectopic pregnancy is one in which tearing or bursting of a fallopian tube causes internal bleeding. Often, there is intense lower abdominal pain, and vaginal bleeding sometimes occurs. Having an ectopic pregnancy can be life-threatening. If this dangerous condition is not treated, it can lead to blood loss, shock, or even death. What are the causes? The most common cause of this condition is damage to one of the fallopian tubes. A fallopian tube may be narrowed or blocked, and that keeps the fertilized egg from reaching the uterus. What increases the risk? This condition is more likely to develop in women of childbearing age who have different levels of risk. The levels of risk can be divided into three categories. High risk  You have gone through infertility treatment.  You have had an ectopic pregnancy before.  You have had surgery on the fallopian tubes, or another surgical procedure, such as an abortion.  You have had surgery to have the fallopian tubes tied (tubal ligation).  You have problems or diseases of the fallopian tubes.  You have been exposed to diethylstilbestrol (DES). This medicine was used until 1971, and it had effects on babies whose mothers took the medicine.  You become pregnant while using an IUD (intrauterine device) for birth control. Moderate risk  You have a history of infertility.  You have had an STI (sexually transmitted infection).  You have a history of pelvic inflammatory  disease (PID).  You have scarring from endometriosis.  You have multiple sexual partners.  You smoke. Low risk  You have had pelvic surgery.  You use vaginal douches.  You became sexually active before age 18. What are the signs or symptoms? Common symptoms of this condition include normal pregnancy symptoms, such as missing a period, nausea, tiredness, abdominal pain, breast tenderness, and bleeding. However, ectopic pregnancy will have additional symptoms, such as:  Pain with intercourse.  Irregular vaginal bleeding or spotting.  Cramping or pain on one side or in the lower abdomen.  Fast heartbeat, low blood pressure, and sweating.  Passing out while having a bowel movement.  Symptoms of a ruptured ectopic pregnancy and internal bleeding may include:  Sudden, severe pain in the abdomen and pelvis.  Dizziness, weakness, light-headedness, or fainting.  Pain in the shoulder or neck area.  How is this diagnosed? This condition is diagnosed by:  A pelvic exam to locate pain or a mass in the abdomen.  A pregnancy test. This blood test checks for the presence as well as the specific level of pregnancy hormone in the bloodstream.  Ultrasound. This is performed if a pregnancy test is positive. In this test, a probe is inserted into the vagina. The probe will detect a fetus, possibly in a location other than the uterus.  Taking a sample of uterus tissue (dilation and curettage, or D&C).  Surgery to perform a visual exam of the inside of the abdomen using a thin, lighted tube that has a tiny camera on the end (laparoscope).  Culdocentesis. This procedure involves inserting a needle at the top   of the vagina, behind the uterus. If blood is present in this area, it may indicate that a fallopian tube is torn.  How is this treated? This condition is treated with medicine or surgery. Medicine  An injection of a medicine (methotrexate) may be given to cause the pregnancy tissue  to be absorbed. This medicine may save your fallopian tube. It may be given if: ? The diagnosis is made early, with no signs of active bleeding. ? The fallopian tube has not ruptured. ? You are considered to be a good candidate for the medicine. Usually, pregnancy hormone blood levels are checked after methotrexate treatment. This is to be sure that the medicine is effective. It may take 4-6 weeks for the pregnancy to be absorbed. Most pregnancies will be absorbed by 3 weeks. Surgery  A laparoscope may be used to remove the pregnancy tissue.  If severe internal bleeding occurs, a larger cut (incision) may be made in the lower abdomen (laparotomy) to remove the fetus and placenta. This is done to stop the bleeding.  Part or all of the fallopian tube may be removed (salpingectomy) along with the fetus and placenta. The fallopian tube may also be repaired during the surgery.  In very rare circumstances, removal of the uterus (hysterectomy) may be required.  After surgery, pregnancy hormone testing may be done to be sure that there is no pregnancy tissue left. Whether your treatment is medicine or surgery, you may receive a Rho (D) immune globulin shot to prevent problems with any future pregnancy. This shot may be given if:  You are Rh-negative and the baby's father is Rh-positive.  You are Rh-negative and you do not know the Rh type of the baby's father.  Follow these instructions at home:  Rest and limit your activity after the procedure for as long as told by your health care provider.  Until your health care provider says that it is safe: ? Do not lift anything that is heavier than 10 lb (4.5 kg), or the limit that your health care provider tells you. ? Avoid physical exercise and any movement that requires effort (is strenuous).  To help prevent constipation: ? Eat a healthy diet that includes fruits, vegetables, and whole grains. ? Drink 6-8 glasses of water per day. Get help  right away if:  You develop worsening pain that is not relieved by medicine.  You have: ? A fever or chills. ? Vaginal bleeding. ? Redness and swelling at the incision site. ? Nausea and vomiting.  You feel dizzy or weak.  You feel light-headed or you faint. This information is not intended to replace advice given to you by your health care provider. Make sure you discuss any questions you have with your health care provider. Document Released: 06/20/2004 Document Revised: 01/10/2016 Document Reviewed: 12/13/2015 Elsevier Interactive Patient Education  2018 [employer]  

## 2018-04-29 NOTE — MAU Note (Signed)
Pt presents to MAU with complaints of light vaginal bleeding for a couple of days and the bleeding increased today. Lower abdominal cramping in which the patient states is not new.

## 2018-04-29 NOTE — MAU Provider Note (Signed)
History     CSN: [id number]  Arrival date and time: 04/29/18 1514   First Provider Initiated Contact with Patient 04/29/18 1550      Chief Complaint  Patient presents with  . Vaginal Bleeding  . Abdominal Pain   Adrienne Craig is a 22 y.o. G2P1001 at [redacted]w[redacted]d who presents today with vaginal bleeding and cramping. She was seen on 04/17/18, no IUP seen. HCG was 68. Patient did not keep FU visit for repeat HCG. She states that she has continued to have bleeding and cramping off and on.   Vaginal Bleeding  The patient's primary symptoms include pelvic pain and vaginal bleeding. This is a new problem. The current episode started in the past 7 days. The problem occurs intermittently. The problem has been unchanged. Pain severity now: 4/10. The problem affects both sides. The vaginal discharge was bloody. The vaginal bleeding is spotting. She has not been passing clots. She has not been passing tissue. Menstrual history: LMP 03/10/18     OB History    Gravida  2   Para  1   Term  1   Preterm  0   AB  0   Living  1     SAB  0   TAB  0   Ectopic  0   Multiple  0   Live Births  1           Past Medical History:  Diagnosis Date  . Depression    mild PP, doing ok now  . Kidney stones     Past Surgical History:  Procedure Laterality Date  . NO PAST SURGERIES      Family History  Problem Relation Age of Onset  . Diabetes Mother   . Arthritis Mother   . Heart disease Mother   . Stroke Mother   . Asthma Mother   . Healthy Father     Social History   Tobacco Use  . Smoking status: Never Smoker  . Smokeless tobacco: Never Used  Substance Use Topics  . Alcohol use: No  . Drug use: No    Allergies: No Known Allergies  Medications Prior to Admission  Medication Sig Dispense Refill Last Dose  . prenatal vitamin w/FE, FA (PRENATAL 1 + 1) 27-1 MG TABS tablet Take 1 tablet by mouth daily at 12 noon. 30 tablet 11     Review of Systems  Genitourinary:  Positive for pelvic pain and vaginal bleeding.   Physical Exam   Blood pressure 117/69, pulse 84, temperature 98.1 F (36.7 C), resp. rate 18, last menstrual period 03/10/2018, currently breastfeeding.  Physical Exam  Nursing note and vitals reviewed. Constitutional: She is oriented to person, place, and time. She appears well-developed and well-nourished. No distress.  HENT:  Head: Normocephalic.  Cardiovascular: Normal rate.  Respiratory: Effort normal.  GI: Soft. There is no tenderness. There is no rebound.  Neurological: She is alert and oriented to person, place, and time.  Skin: Skin is warm and dry.  Psychiatric: She has a normal mood and affect.     Results for orders placed or performed during the hospital encounter of 04/29/18 (from the past 24 hour(s))  Urinalysis, Routine w reflex microscopic     Status: Abnormal   Collection Time: 04/29/18  3:29 PM  Result Value Ref Range   Color, Urine YELLOW YELLOW   APPearance CLEAR CLEAR   Specific Gravity, Urine 1.019 1.005 - 1.030   pH 5.0 5.0 - 8.0   Glucose,  UA NEGATIVE NEGATIVE mg/dL   Hgb urine dipstick LARGE (A) NEGATIVE   Bilirubin Urine NEGATIVE NEGATIVE   Ketones, ur NEGATIVE NEGATIVE mg/dL   Protein, ur NEGATIVE NEGATIVE mg/dL   Nitrite NEGATIVE NEGATIVE   Leukocytes, UA TRACE (A) NEGATIVE   RBC / HPF 0-5 0 - 5 RBC/hpf   WBC, UA 0-5 0 - 5 WBC/hpf   Bacteria, UA NONE SEEN NONE SEEN   Squamous Epithelial / LPF 0-5 0 - 5   Mucus PRESENT   CBC     Status: None   Collection Time: 04/29/18  3:46 PM  Result Value Ref Range   WBC 8.7 4.0 - 10.5 K/uL   RBC 4.43 3.87 - 5.11 MIL/uL   Hemoglobin 14.3 12.0 - 15.0 g/dL   HCT 62.941.7 52.836.0 - 41.346.0 %   MCV 94.1 80.0 - 100.0 fL   MCH 32.3 26.0 - 34.0 pg   MCHC 34.3 30.0 - 36.0 g/dL   RDW 24.412.1 01.011.5 - 27.215.5 %   Platelets 228 150 - 400 K/uL   nRBC 0.0 0.0 - 0.2 %  hCG, quantitative, pregnancy     Status: Abnormal   Collection Time: 04/29/18  3:46 PM  Result Value Ref Range    hCG, Beta Chain, Quant, S 131 (H) <5 mIU/mL   [place] Ob Transvaginal  Result Date: 04/29/2018 CLINICAL DATA:  Pelvic pain and vaginal bleeding. Gestational age by LMP of 7 weeks 1 day. EXAM: TRANSVAGINAL OB ULTRASOUND TECHNIQUE: Transvaginal ultrasound was performed for complete evaluation of the gestation as well as the maternal uterus, adnexal regions, and pelvic cul-de-sac. COMPARISON:  None. FINDINGS: Intrauterine gestational sac: Single, with irregular shape Yolk sac:  Not Visualized. Embryo:  Not Visualized. MSD: 6 mm   5 w   2 d Subchorionic hemorrhage:  None visualized. Maternal uterus/adnexae: Normal appearance of both ovaries. No mass or abnormal free fluid identified. IMPRESSION: Single approximately 5 week intrauterine gestational sac with irregular shape, which is a poor prognostic sign. Suggest correlation with serial b-hCG levels, and consider followup ultrasound to assess viability in 10-14 days. No maternal uterine or adnexal abnormality identified. Electronically Signed   By: Myles RosenthalJohn  Stahl M.D.   On: 04/29/2018 17:11     MAU Course  Procedures  MDM HCG did not rise as expected between 11/22 and today. Patient advised that this is most likely MAB, but potential for occult ectopic is still there. Recommend repeat HCG in 48 hours in the clinic.   Assessment and Plan   1. Pregnancy, location unknown   2. Inappropriate change in quantitative hCG in early pregnancy    DC home Comfort measures reviewed  Bleeding precautions Ectopic precautions RX: none  Return to MAU as needed FU with OB as planned  Follow-up Information    [facility]   Specialty:  Obstetrics and Gynecology Why:  Please come to the clinic friday for repeat blood work. They will call you with a time.  Contact information: [address] [place] [id number] [phone]           Thressa ShellerHeather  04/29/2018, 3:52 PM

## 2018-05-01 ENCOUNTER — Ambulatory Visit (INDEPENDENT_AMBULATORY_CARE_PROVIDER_SITE_OTHER): Payer: Medicaid Other

## 2018-05-01 ENCOUNTER — Ambulatory Visit: Payer: Medicaid Other

## 2018-05-01 DIAGNOSIS — O3680X0 Pregnancy with inconclusive fetal viability, not applicable or unspecified: Secondary | ICD-10-CM

## 2018-05-01 LAB — HCG, QUANTITATIVE, PREGNANCY: HCG, BETA CHAIN, QUANT, S: 17 m[IU]/mL — AB (ref <5)

## 2018-05-01 NOTE — Progress Notes (Signed)
Pt here today for STAT beta lab.  Pt reports having some abdominal cramping and some bleeding.  Pt advised that she will wait two hours for results and f/u.  Pt agreed.    Notified Dr. Macon LargeAnyanwu pt's results.  Provider recommendation is to come back in one week for non stat beta lab and two weeks to see a provider for birth control.  Pt stated understanding.

## 2018-05-04 NOTE — Progress Notes (Signed)
I have reviewed the chart and agree with nursing staff's documentation of this patient's encounter.  Adrienne CollinsUgonna Lynleigh Kovack, MD 05/04/2018 12:05 PM

## 2018-05-08 ENCOUNTER — Other Ambulatory Visit: Payer: Medicaid Other

## 2018-05-11 ENCOUNTER — Other Ambulatory Visit: Payer: Medicaid Other

## 2018-05-11 ENCOUNTER — Other Ambulatory Visit: Payer: Self-pay | Admitting: General Practice

## 2018-05-11 DIAGNOSIS — O039 Complete or unspecified spontaneous abortion without complication: Secondary | ICD-10-CM

## 2018-05-12 LAB — BETA HCG QUANT (REF LAB)

## 2018-05-14 NOTE — Progress Notes (Signed)
Resolved pregnancy of unknown location, no further lab draws needed. Follow up as scheduled in the office. Results were released to MyChart and patient was given recommendations as indicated.  Jaynie CollinsUgonna Haide Klinker, MD

## 2018-06-01 ENCOUNTER — Ambulatory Visit: Payer: Medicaid Other | Admitting: Advanced Practice Midwife

## 2018-09-28 ENCOUNTER — Telehealth (INDEPENDENT_AMBULATORY_CARE_PROVIDER_SITE_OTHER): Payer: Medicaid Other | Admitting: Obstetrics and Gynecology

## 2018-09-28 DIAGNOSIS — N61 Mastitis without abscess: Secondary | ICD-10-CM

## 2018-09-28 MED ORDER — CEPHALEXIN 500 MG PO CAPS: 500.0000 mg | ORAL_CAPSULE | Freq: Three times a day (TID) | ORAL | 0 refills | Status: DC

## 2018-09-28 NOTE — Telephone Encounter (Signed)
Returned pt call. Pt is BF her son who is almost 23 year old. Mom has been trying to wean infant.  Pt reports a painful lump noted in her breast yesterday along with fever and flu like symptoms, pt reports area is reddened today and lump is still there and painful. Fever is 100.4 and she has been taking Motrin.   Advised pt that warm moist compresses for 20 minutes every 2-3 hours and then follow with feeding the infant ans/or hand expressing to get rid of lump. Discussed with mom that some patients have seen benefits from taking Vitamin C and Probiotics along with the ATB.   Discussed I will discuss with provider and let her know if ATB will be called in for symptoms of Mastitis.   Spoke with Dr. Adrian Blackwater who ordered Keflex 500 mg TID for 7 days. Instructed to take all medication and to let us know if she is not feeling better after all meds have been completed. Pt voiced understanding.

## 2018-09-28 NOTE — Addendum Note (Signed)
Addended by: Ed Blalock on: 09/28/2018 03:56 PM   Modules accepted: Orders

## 2018-09-28 NOTE — Telephone Encounter (Signed)
Stated she has a fever, a knot on her left breast. It started it yesterday. The patient is also breast feeding. Stated the call center advised her to call our office as it could be an infection.

## 2019-03-09 ENCOUNTER — Encounter (HOSPITAL_COMMUNITY): Payer: Self-pay

## 2019-10-08 IMAGING — US US MFM OB DETAIL+14 WK
1 series · 14 of 28 positions shown · non-contrast
Comparison: none

[Series 1: us mfm ob detail+14 wk · 76 acquisitions, 14 frames shown]
[im 3/76]
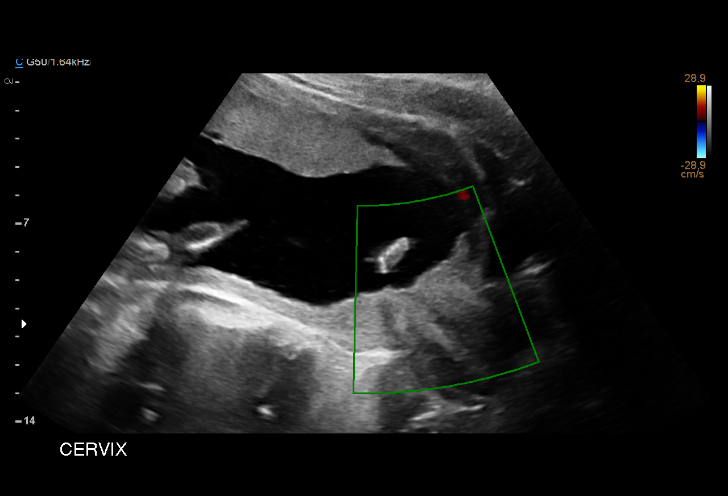
[im 9/76]
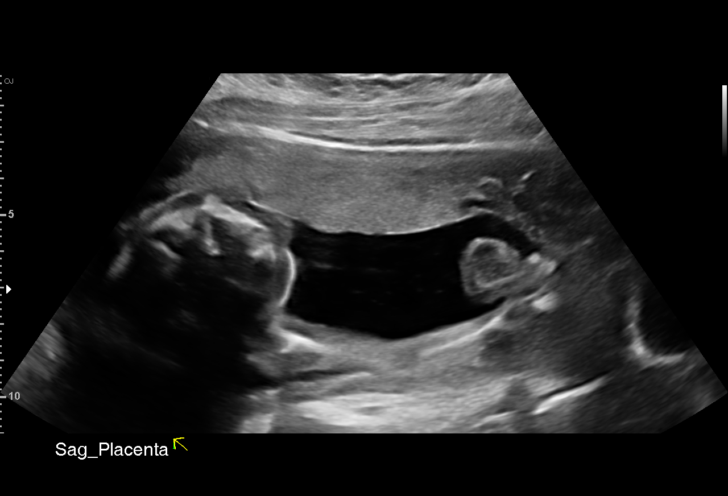
[im 14/76]
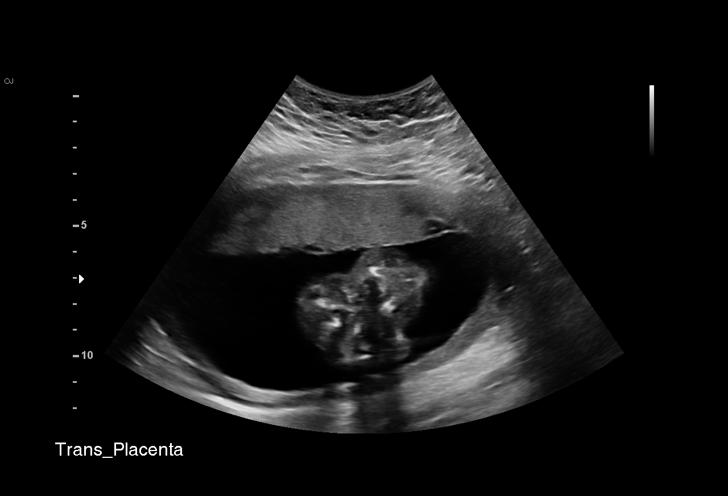
[im 20/76]
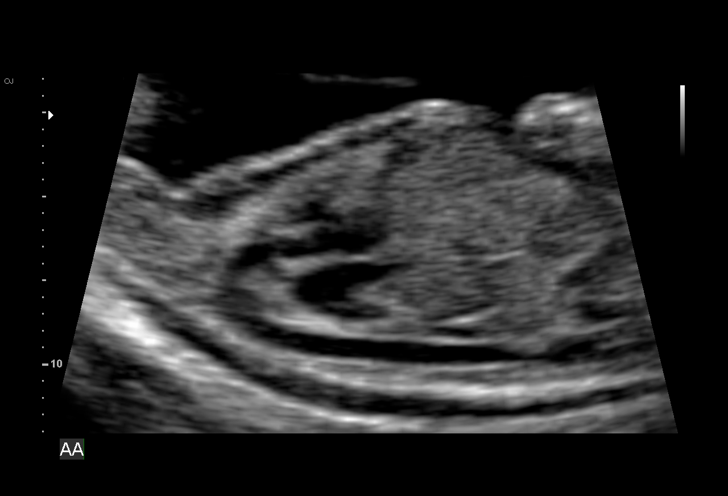
[im 26/76]
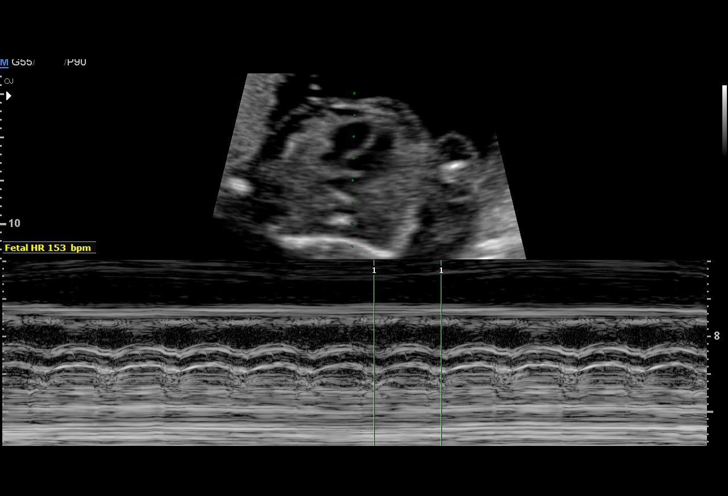
[im 31/76]
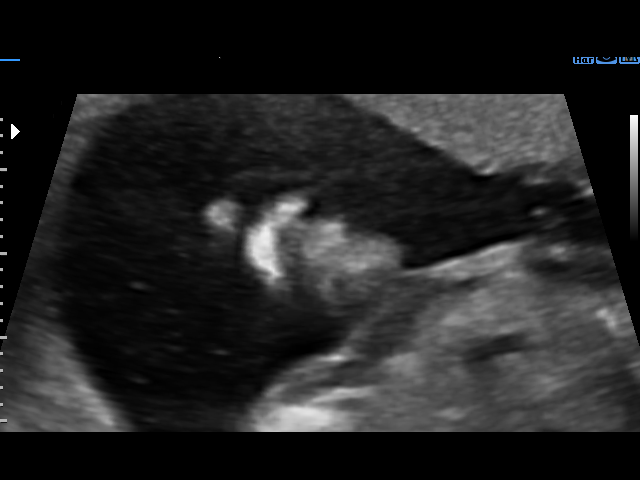
[im 37/76]
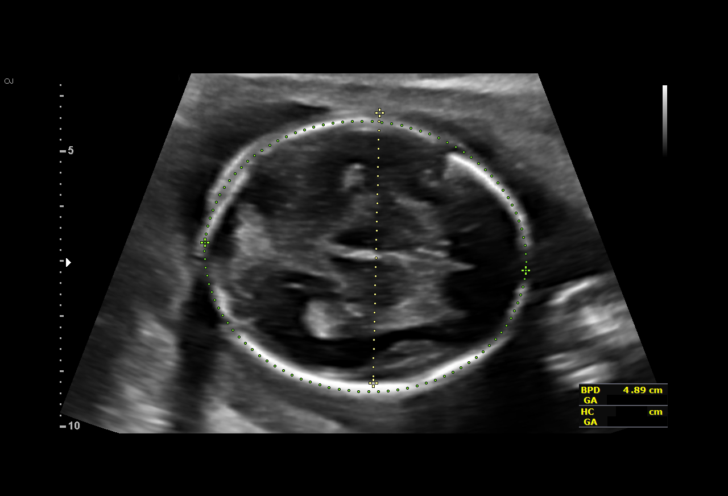
[im 42/76]
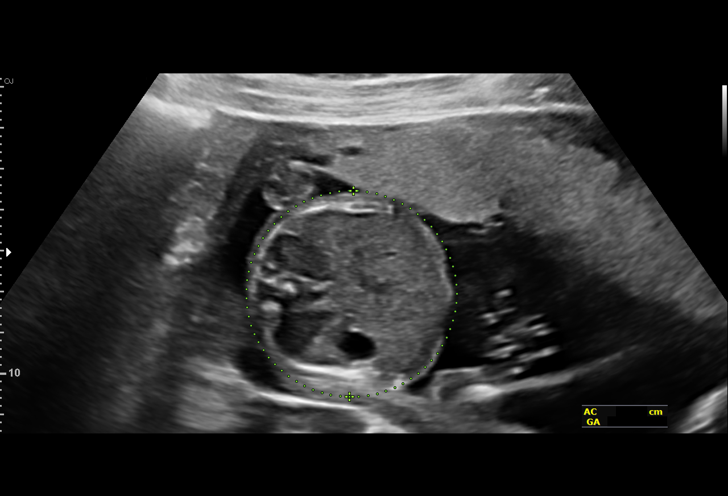
[im 48/76]
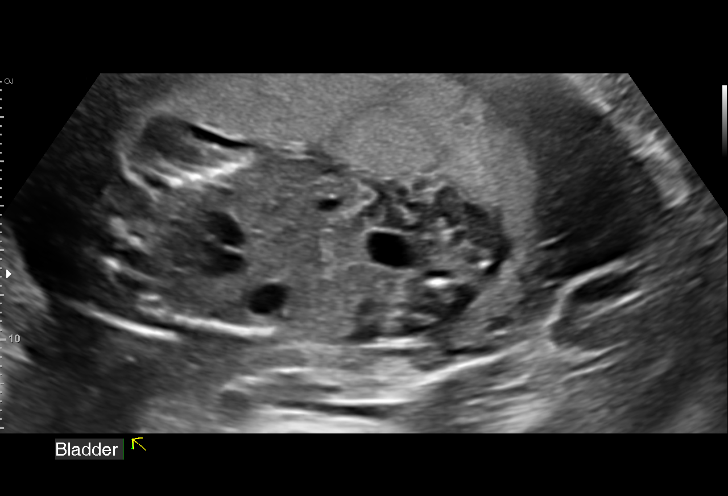
[im 53/76]
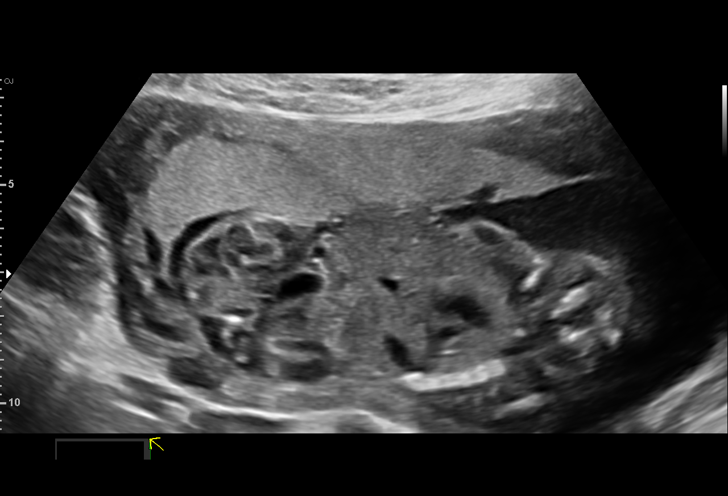
[im 59/76]
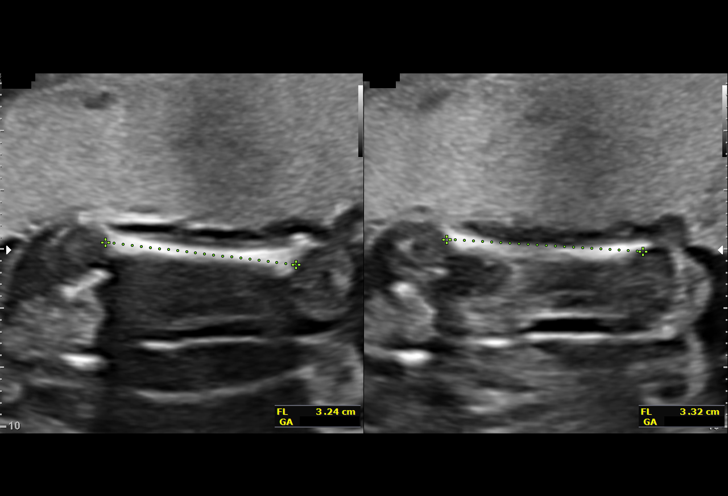
[im 64/76]
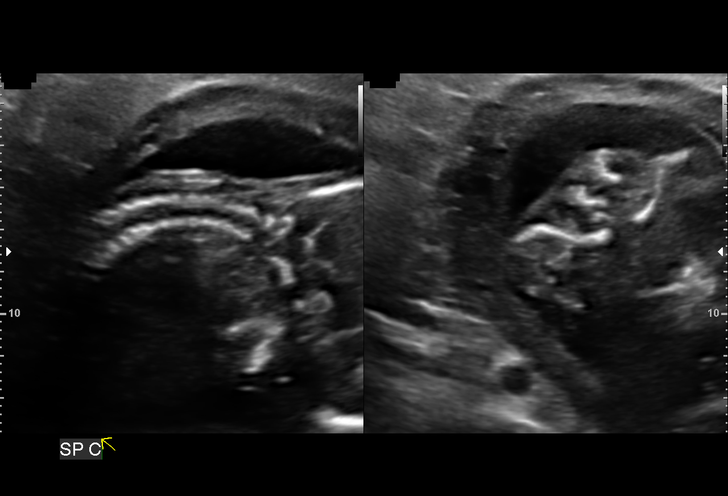
[im 70/76]
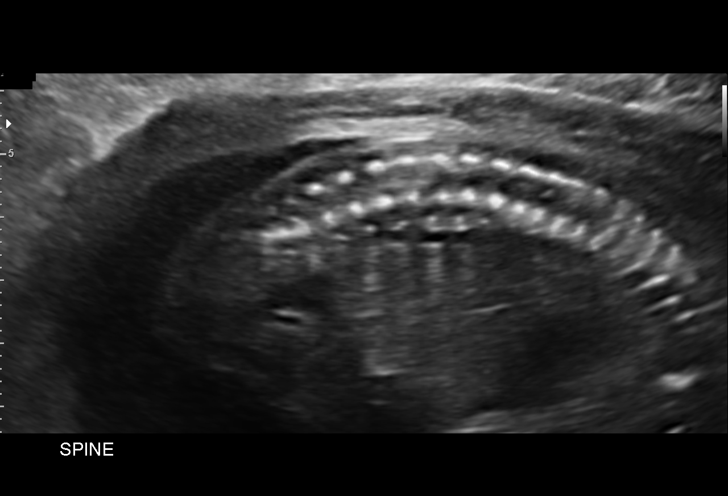
[im 76/76]
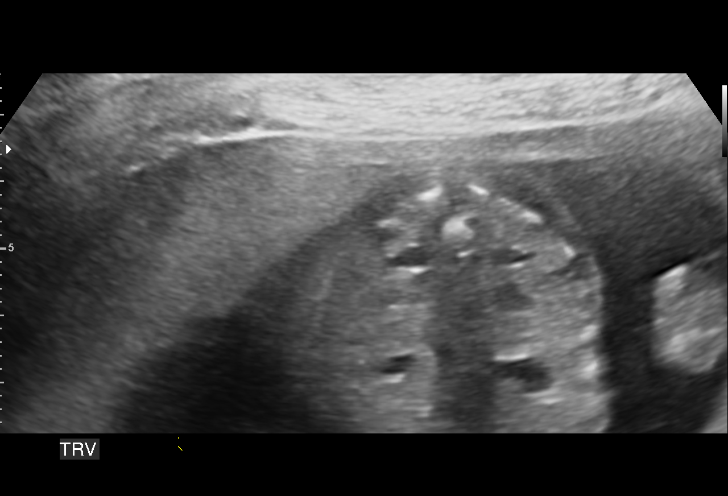

[14 of 28 positions shown; findings below may reference images not displayed]

OB/Gyn Clinic
[REDACTED]. [HOSPITAL],

1  MINI MARTINELLI            [id number]      [id number]     [id number]
Indications

19 weeks gestation of pregnancy
Encounter for antenatal screening for
malformations
Obesity complicating pregnancy, second
trimester (pre-preg BMI 31)
OB History

Blood Type:            Height:  5'2"   Weight (lb):  180       BMI:
Gravidity:    1         Term:   0        Prem:   0        SAB:   0
TOP:          0       Ectopic:  0        Living: 0
Fetal Evaluation

Num Of Fetuses:     1
Fetal Heart         153
Rate(bpm):
Cardiac Activity:   Observed
Presentation:       Breech
Placenta:           Anterior, above cervical os
P. Cord Insertion:  Visualized
Amniotic Fluid
AFI FV:      Subjectively within normal limits

Largest Pocket(cm)
5.95
Biometry

BPD:      48.6  mm     G. Age:  20w 5d         82  %    CI:        82.31   %    70 - 86
FL/HC:      19.3   %    16.8 -
HC:       169   mm     G. Age:  19w 4d         28  %    HC/AC:      1.06        1.09 -
AC:      159.7  mm     G. Age:  21w 0d         81  %    FL/BPD:     67.1   %
FL:       32.6  mm     G. Age:  20w 1d         54  %    FL/AC:      20.4   %    20 - 24
HUM:      31.1  mm     G. Age:  20w 2d         66  %
CER:      21.8  mm     G. Age:  20w 6d         65  %
NFT:       4.4  mm
CM:          4  mm

Est. FW:     362  gm    0 lb 13 oz      58  %
Gestational Age

LMP:           19w 6d        Date:  12/25/16                 EDD:   10/01/17
U/S Today:     20w 3d                                        EDD:   09/27/17
Best:          19w 6d     Det. By:  LMP  (12/25/16)          EDD:   10/01/17
Anatomy

Cranium:               Appears normal         Aortic Arch:            Appears normal
Cavum:                 Appears normal         Ductal Arch:            Appears normal
Ventricles:            Appears normal         Diaphragm:              Appears normal
Choroid Plexus:        Appears normal         Stomach:                Appears normal, left
sided
Cerebellum:            Appears normal         Abdomen:                Appears normal
Posterior Fossa:       Appears normal         Abdominal Wall:         Appears nml (cord
insert, abd wall)
Nuchal Fold:           Appears normal         Cord Vessels:           Appears normal (3
vessel cord)
Face:                  Appears normal         Kidneys:                Appear normal
(orbits and profile)
Lips:                  Appears normal         Bladder:                Appears normal
Thoracic:              Appears normal         Spine:                  Appears normal
Heart:                 Appears normal         Upper Extremities:      Appears normal
(4CH, axis, and situs
RVOT:                  Appears normal         Lower Extremities:      Appears normal
LVOT:                  Appears normal

Other:  Parents do not wish to know sex of fetus at this time! (MALE ) Heels
visualized.
Cervix Uterus Adnexa

Cervix
Length:           3.71  cm.
Normal appearance by transabdominal scan.

Uterus
Normal shape and size.
Left Ovary
Size(cm)       2.8  x   1.3    x  1.6       Vol(ml): 3
Within normal limits.

Right Ovary
Size(cm)       2.7  x   1.9    x  2.1       Vol(ml):
Within normal limits.

Cul De Sac:   No free fluid seen.

Adnexa:       No abnormality visualized.
Impression

SIUP at 19+6 weeks with cardiac activity
Normal detailed fetal anatomy
Markers of aneuploidy: none
Normal amniotic fluid volume
Measurements consistent with LMP dating
Recommendations

Follow-up as clinically indicated

## 2020-02-15 ENCOUNTER — Other Ambulatory Visit: Payer: Self-pay

## 2020-02-15 ENCOUNTER — Other Ambulatory Visit: Payer: Medicaid Other

## 2020-02-15 DIAGNOSIS — Z20822 Contact with and (suspected) exposure to covid-19: Secondary | ICD-10-CM

## 2020-02-17 LAB — SARS-COV-2, NAA 2 DAY TAT

## 2020-02-17 LAB — NOVEL CORONAVIRUS, NAA: SARS-CoV-2, NAA: NOT DETECTED

## 2020-02-17 LAB — SPECIMEN STATUS REPORT

## 2020-07-11 ENCOUNTER — Encounter (HOSPITAL_COMMUNITY): Payer: Self-pay | Admitting: Emergency Medicine

## 2020-07-11 ENCOUNTER — Other Ambulatory Visit: Payer: Self-pay

## 2020-07-11 ENCOUNTER — Emergency Department (HOSPITAL_COMMUNITY)
Admission: EM | Admit: 2020-07-11 | Discharge: 2020-07-11 | Disposition: A | Discharge status: Home / Self Care | Payer: Medicaid Other | Attending: Emergency Medicine | Admitting: Emergency Medicine

## 2020-07-11 DIAGNOSIS — R1012 Left upper quadrant pain: Secondary | ICD-10-CM | POA: Diagnosis present

## 2020-07-11 DIAGNOSIS — Z87442 Personal history of urinary calculi: Secondary | ICD-10-CM | POA: Insufficient documentation

## 2020-07-11 DIAGNOSIS — R112 Nausea with vomiting, unspecified: Secondary | ICD-10-CM | POA: Insufficient documentation

## 2020-07-11 DIAGNOSIS — R197 Diarrhea, unspecified: Secondary | ICD-10-CM | POA: Diagnosis not present

## 2020-07-11 LAB — URINALYSIS, ROUTINE W REFLEX MICROSCOPIC
Bilirubin Urine: NEGATIVE
Glucose, UA: NEGATIVE mg/dL
Ketones, ur: NEGATIVE mg/dL
Leukocytes,Ua: NEGATIVE
Nitrite: NEGATIVE
Protein, ur: NEGATIVE mg/dL
Specific Gravity, Urine: 1.016 (ref 1.005–1.030)
pH: 6 (ref 5.0–8.0)

## 2020-07-11 LAB — CBC
HCT: 40.7 % (ref 36.0–46.0)
Hemoglobin: 14.1 g/dL (ref 12.0–15.0)
MCH: 31.6 pg (ref 26.0–34.0)
MCHC: 34.6 g/dL (ref 30.0–36.0)
MCV: 91.3 fL (ref 80.0–100.0)
Platelets: 264 10*3/uL (ref 150–400)
RBC: 4.46 MIL/uL (ref 3.87–5.11)
RDW: 11.7 % (ref 11.5–15.5)
WBC: 9.2 10*3/uL (ref 4.0–10.5)
nRBC: 0 % (ref 0.0–0.2)

## 2020-07-11 LAB — COMPREHENSIVE METABOLIC PANEL
ALT: 25 U/L (ref 0–44)
AST: 19 U/L (ref 15–41)
Albumin: 4.1 g/dL (ref 3.5–5.0)
Alkaline Phosphatase: 64 U/L (ref 38–126)
Anion gap: 11 (ref 5–15)
BUN: 12 mg/dL (ref 6–20)
CO2: 23 mmol/L (ref 22–32)
Calcium: 9.5 mg/dL (ref 8.9–10.3)
Chloride: 102 mmol/L (ref 98–111)
Creatinine, Ser: 0.7 mg/dL (ref 0.44–1.00)
GFR, Estimated: >60 mL/min (ref >60)
Glucose, Bld: 88 mg/dL (ref 70–99)
Potassium: 3.9 mmol/L (ref 3.5–5.1)
Sodium: 136 mmol/L (ref 135–145)
Total Bilirubin: 1.1 mg/dL (ref 0.3–1.2)
Total Protein: 7.6 g/dL (ref 6.5–8.1)

## 2020-07-11 LAB — LIPASE, BLOOD: Lipase: 31 U/L (ref 11–51)

## 2020-07-11 LAB — I-STAT BETA HCG BLOOD, ED (MC, WL, AP ONLY): I-stat hCG, quantitative: <5 m[IU]/mL (ref <5)

## 2020-07-11 MED ORDER — ONDANSETRON 4 MG PO TBDP
4.0000 mg | ORAL_TABLET | Freq: Once | ORAL | Status: AC
  Administered 2020-07-11: 4 mg via ORAL
  Filled 2020-07-11: qty 1

## 2020-07-11 MED ORDER — LIDOCAINE VISCOUS HCL 2 % MT SOLN
15.0000 mL | Freq: Once | OROMUCOSAL | Status: AC
  Administered 2020-07-11: 15 mL via ORAL
  Filled 2020-07-11: qty 15

## 2020-07-11 MED ORDER — ALUM & MAG HYDROXIDE-SIMETH 200-200-20 MG/5ML PO SUSP
30.0000 mL | Freq: Once | ORAL | Status: AC
  Administered 2020-07-11: 30 mL via ORAL
  Filled 2020-07-11: qty 30

## 2020-07-11 MED ORDER — OXYCODONE-ACETAMINOPHEN 5-325 MG PO TABS
1.0000 | ORAL_TABLET | Freq: Once | ORAL | Status: AC
  Administered 2020-07-11: 1 via ORAL
  Filled 2020-07-11: qty 1

## 2020-07-11 MED ORDER — ONDANSETRON HCL 4 MG PO TABS: 4.0000 mg | ORAL_TABLET | Freq: Three times a day (TID) | ORAL | 0 refills | Status: DC | PRN

## 2020-07-11 NOTE — ED Triage Notes (Signed)
Patient reports mid/upper abdominal pain with emesis and diarrhea onset yesterday morning , no fever or chills.

## 2020-07-11 NOTE — Discharge Instructions (Addendum)
You came to the emergency department today to be evaluated for your abdominal pain.  Your lab work and physical exam were reassuring.  Your symptoms are likely due to gastroenteritis and will improve with time.  Please eat a bland diet until your symptoms improve.  Please take frequent small sips of water your symptoms resolved.  I have given you prescription for Zofran.  You may take 1 pill every 8 hours as needed for nausea or vomiting.    Get help right away if you: Have chest pain. Feel extremely weak or you faint. See blood in your vomit. Have vomit that looks like coffee grounds. Have bloody or black stools or stools that look like tar. Have a severe headache, a stiff neck, or both. Have a rash. Have severe pain, cramping, or bloating in your abdomen. Have trouble breathing or you are breathing very quickly. Have a fast heartbeat. Have skin that feels cold and clammy. Feel confused. Have pain when you urinate. Have signs of dehydration, such as: Dark urine, very little urine, or no urine. Cracked lips. Dry mouth. Sunken eyes. Sleepiness. Weakness.

## 2020-07-11 NOTE — ED Provider Notes (Signed)
[facility] DEPART[facility] Provider Note   CSN: [id number] Arrival date & time: 07/11/20  0938     History Chief Complaint  Patient presents with  . Abdominal Pain    Adrienne Craig is a 25 y.o. female with a history of depression, and kidney stones.  She presents with a chief complaint of left upper quadrant abdominal pain.  She reports that her pain began yesterday morning and was intermittent throughout the day.  Patient reports that this morning around 0300 the pain became worse.  Patient rates her pain as a 6/10 on the pain scale, does not radiate.  Pain is worse with changing positions.  No alleviating factors.  Patient denies any change in pain with eating.  Patient describes her pain as a "twisting," saying it " feels like a muscle spasm or cramping."  Patient reports associated nausea, vomiting, and diarrhea.  Patient endorses vomiting 3 times last 24 hours.  Patient denies any bloody emesis or coffee-ground emesis.    Patient denies any fevers, nasal congestion, rhinorrhea, sore throat, abdominal distention, blood in stool, dysuria, urinary frequency, hematuria, vaginal bleeding, vaginal discharge, vaginal pain, back pain, myalgias.  LMP ended Friday.  Patient is not currently sexually active with last sexual encounter in October 2021.  Patient reports previous scared in 2019.  HPI     Past Medical History:  Diagnosis Date  . Depression    mild PP, doing ok now  . Kidney stones     Patient Active Problem List   Diagnosis Date Noted  . Bacterial vaginitis 04/17/2018  . Pregnancy of unknown anatomic location 04/17/2018  . 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 [employer]    . NO PAST SURGERIES       OB History    Gravida  2   Para  1   Term  1   Preterm  0   AB  0   Living  1     SAB  0   IAB  0   Ectopic  0   Multiple  0   Live Births  1           Family  History  Problem Relation Age of Onset  . Diabetes Mother   . Arthritis Mother   . Heart disease Mother   . Stroke Mother   . Asthma Mother   . Healthy Father     Social History   Tobacco Use  . Smoking status: Never Smoker  . Smokeless tobacco: Never Used  Vaping Use  . Vaping Use: Never used  Substance Use Topics  . Alcohol use: No  . Drug use: No    Home Medications Prior to Admission medications   Medication Sig Start Date End Date Taking? Authorizing Provider  cephALEXin (KEFLEX) 500 MG capsule Take 1 capsule (500 mg total) by mouth 3 (three) times daily. 09/28/18   Truett Mainland, DO    Allergies    Patient has no known allergies.  Review of Systems   Review of Systems  Constitutional: Positive for chills. Negative for fever.  HENT: Negative for congestion, rhinorrhea and sore throat.   Eyes: Negative for visual disturbance.  Respiratory: Negative for cough and shortness of breath.   Cardiovascular: Negative for chest pain.  Gastrointestinal: Positive for abdominal pain, diarrhea, nausea and vomiting. Negative for abdominal distention, anal bleeding, blood in stool, constipation and rectal pain.  Genitourinary: Negative for  decreased urine volume, difficulty urinating, dysuria, flank pain, frequency, genital sores, hematuria, pelvic pain, vaginal bleeding, vaginal discharge and vaginal pain.  Musculoskeletal: Negative for back pain, myalgias and neck pain.  Skin: Negative for color change and rash.  Neurological: Negative for dizziness, syncope, light-headedness and headaches.  Psychiatric/Behavioral: Negative for confusion.    Physical Exam Updated Vital Signs BP 99/77 (BP Location: Left Arm)   Pulse 64   Temp 97.6 F (36.4 C) (Oral)   Resp 16   LMP 07/04/2020   SpO2 98%   Physical Exam Vitals and nursing note reviewed.  Constitutional:      General: She is not in acute distress.    Appearance: She is obese. She is not ill-appearing, toxic-appearing  or diaphoretic.  HENT:     Head: Normocephalic.  Eyes:     General: No scleral icterus.       Right eye: No discharge.        Left eye: No discharge.  Cardiovascular:     Rate and Rhythm: Normal rate.  Pulmonary:     Effort: Pulmonary effort is normal. No respiratory distress.     Breath sounds: No stridor. No wheezing, rhonchi or rales.  Abdominal:     General: Abdomen is protuberant. Bowel sounds are normal. There is no distension. There are no signs of injury.     Palpations: Abdomen is soft. There is no mass or pulsatile mass.     Tenderness: There is no abdominal tenderness. There is no right CVA tenderness, left CVA tenderness, guarding or rebound. Negative signs include Murphy's sign and McBurney's sign.  Musculoskeletal:     Cervical back: Normal range of motion and neck supple.  Skin:    General: Skin is warm and dry.     Coloration: Skin is not jaundiced or pale.  Neurological:     General: No focal deficit present.     Mental Status: She is alert.  Psychiatric:        Behavior: Behavior is cooperative.     ED Results / Procedures / Treatments   Labs (all labs ordered are listed, but only abnormal results are displayed) Labs Reviewed  URINALYSIS, ROUTINE W REFLEX MICROSCOPIC - Abnormal; Notable for the following components:      Result Value   Hgb urine dipstick MODERATE (*)    Bacteria, UA RARE (*)    All other components within normal limits  LIPASE, BLOOD  COMPREHENSIVE METABOLIC PANEL  CBC  I-STAT BETA HCG BLOOD, ED ([facility], [facility], [facility] ONLY)    EKG None  Radiology No results found.  Procedures Procedures   Medications Ordered in ED Medications  ondansetron (ZOFRAN-ODT) disintegrating tablet 4 mg (4 mg Oral Given 07/11/20 0946)  alum & mag hydroxide-simeth (MAALOX/MYLANTA) 200-200-20 MG/5ML suspension 30 mL (30 mLs Oral Given 07/11/20 0946)    And  lidocaine (XYLOCAINE) 2 % viscous mouth solution 15 mL (15 mLs Oral Given 07/11/20 0947)   oxyCODONE-acetaminophen (PERCOCET/ROXICET) 5-325 MG per tablet 1 tablet (1 tablet Oral Given 07/11/20 1014)    ED Course  I have reviewed the triage vital signs and the nursing notes.  Pertinent labs & imaging results that were available during my care of the patient were reviewed by me and considered in my medical decision making (see chart for details).    MDM Rules/Calculators/A&P                          Alert 25 year old female no  acute distress, nontoxic-appearing.  Patient presents with chief complaint of left upper quadrant abdominal pain.  Endorses associated nausea, vomiting, and diarrhea.    Patient is afebrile, normoactive bowel sounds, abdomen is soft, nondistended, nontender.  No peritonitis.  No CVA tenderness.   I-STAT beta-hCG negative no concern for ectopic pregnancy or intrauterine pregnancy.  Lipase within normal limits less concerning for acute pancreatitis.  AST, ALT, alk phos, total bili all within normal limits less concerning for hepatobiliary disease.  Patient has no dysuria, urinary frequency, urinalysis shows nitrite negative, leukocyte negative, bacteria rare; less concerning for UTI.    Patient was given Zofran, GI cocktail, and percocet.  Patient reports improvement in her symptoms.  On reexamination patient's abdomen remains soft, nondistended, nontender.  Patient's symptoms likely due to gastroenteritis.  Will prescribe Zofran.  Will have patient follow-up with her primary care provider as needed.  Discussed results, findings, treatment and follow up. Patient advised of return precautions. Patient verbalized understanding and agreed with plan.       Final Clinical Impression(s) / ED Diagnoses Final diagnoses:  Left upper quadrant abdominal pain  Nausea vomiting and diarrhea    Rx / DC Orders ED Discharge Orders         Ordered    ondansetron (ZOFRAN) 4 MG tablet  Every 8 hours PRN        07/11/20 1127           Loni Beckwith,  PA-C 07/11/20 2316    Varney Biles, MD 07/12/20 [phone]

## 2020-09-11 IMAGING — US US OB < 14 WEEKS - US OB TV
1 series · 15 of 28 positions shown · non-contrast
Comparison: None.

CLINICAL DATA: Pelvic pain and vaginal bleeding. Gestational age by
LMP of 5 weeks 3 days.

EXAM:
OBSTETRIC <14 WK US AND TRANSVAGINAL OB US
TECHNIQUE: Both transabdominal and transvaginal ultrasound examinations were
performed for complete evaluation of the gestation as well as the
maternal uterus, adnexal regions, and pelvic cul-de-sac.
Transvaginal technique was performed to assess early pregnancy.

[Series 1: us ob < 14 weeks - us ob tv · 15 of 53 slices shown]
[im 1/53]
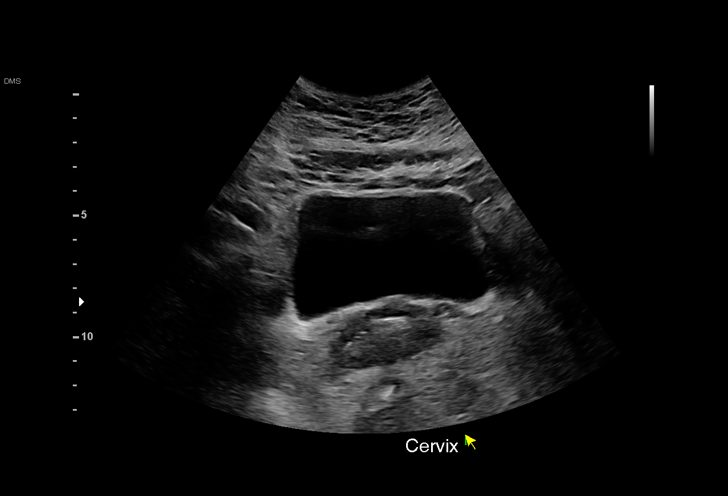
[im 4/53]
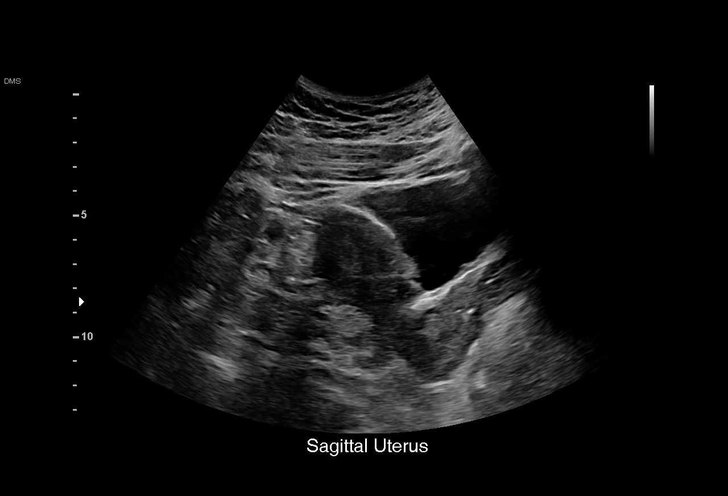
[im 8/53]
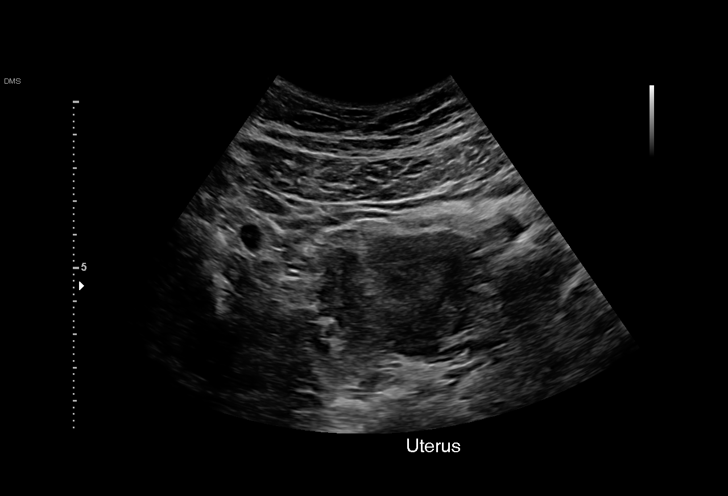
[im 12/53]
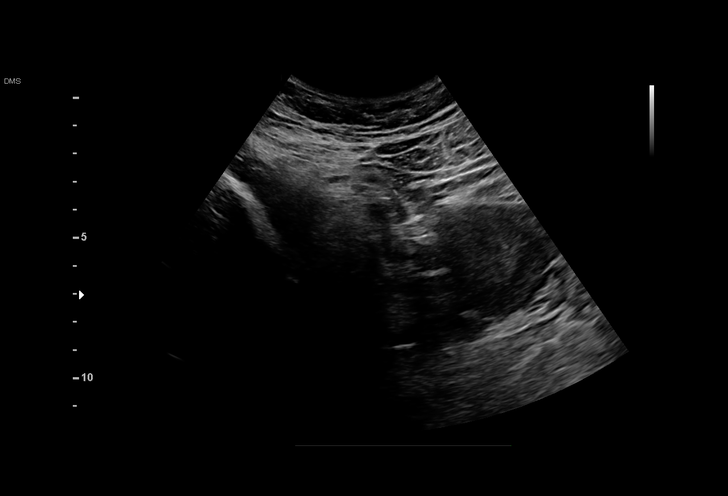
[im 16/53]
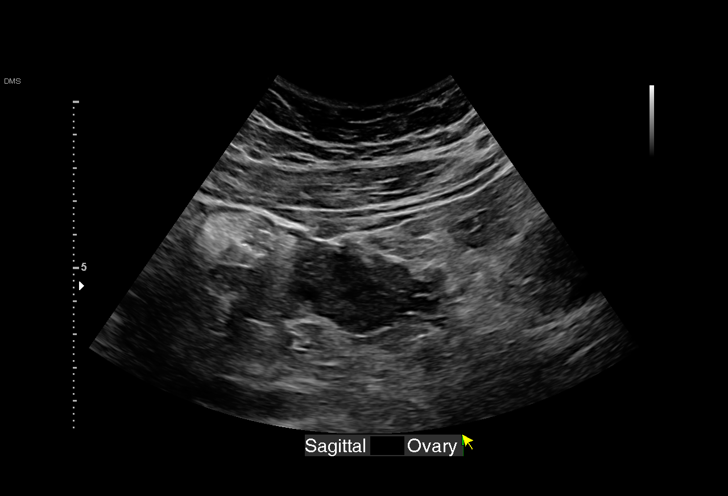
[im 20/53]
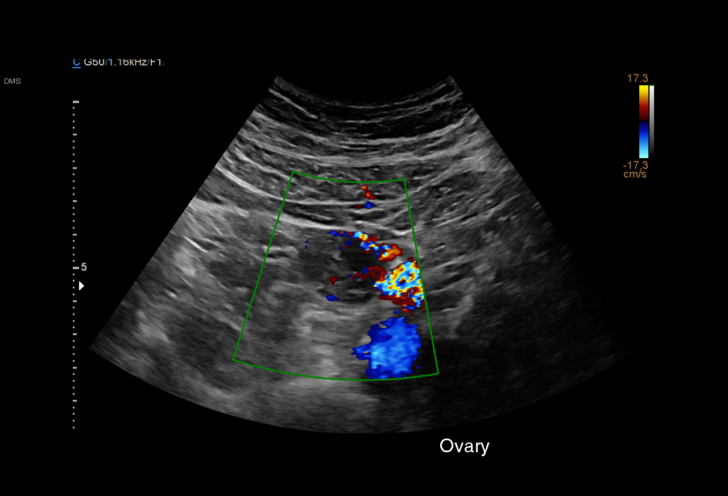
[im 24/53]
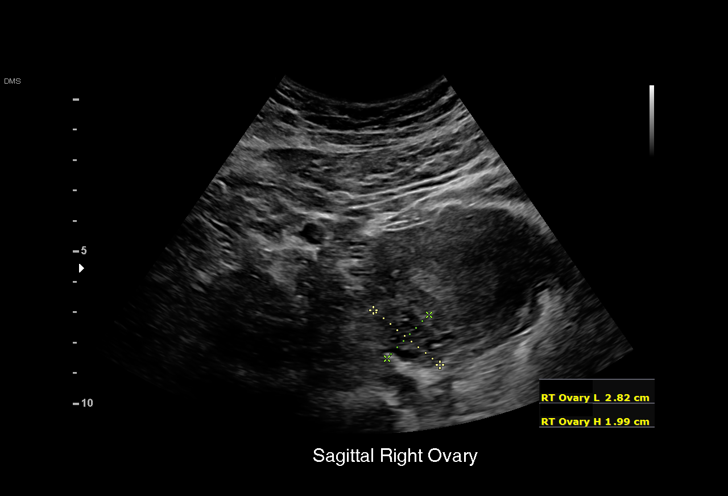
[im 27/53]
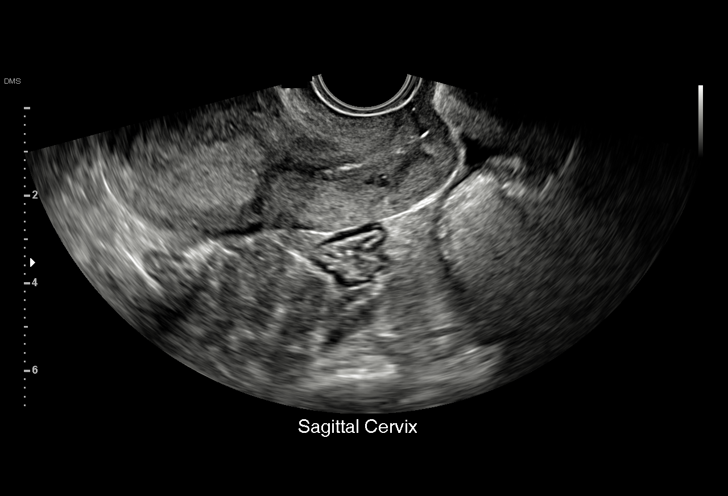
[im 29/53]
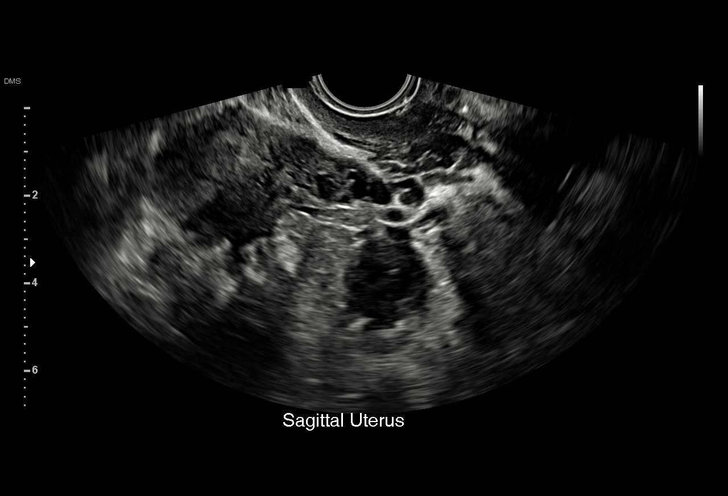
[im 33/53]
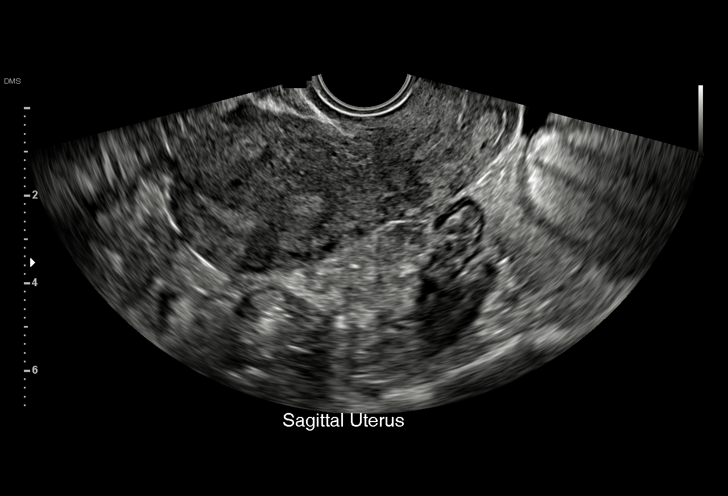
[im 37/53]
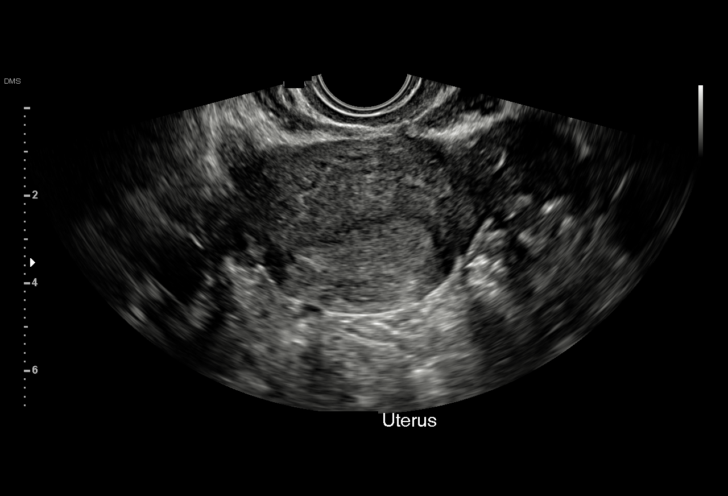
[im 41/53]
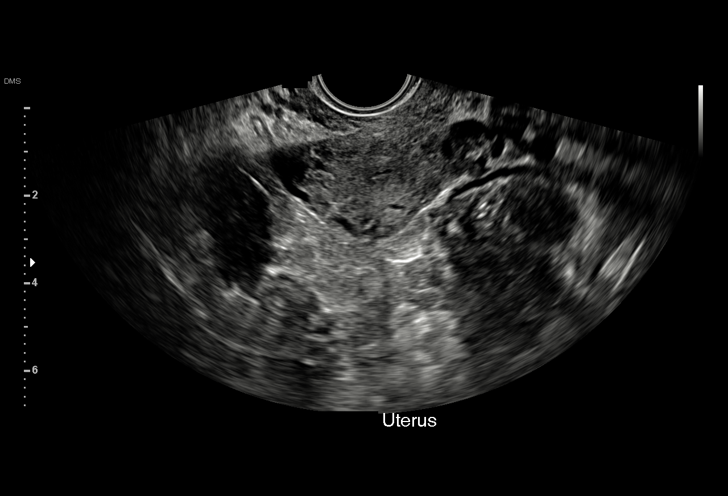
[im 45/53]
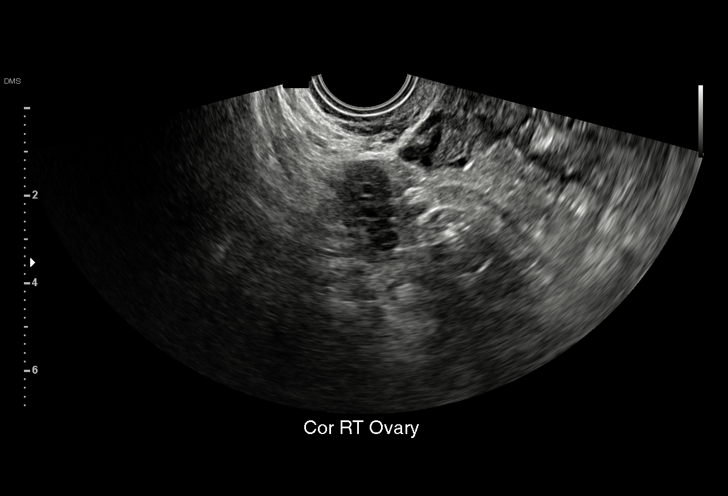
[im 49/53]
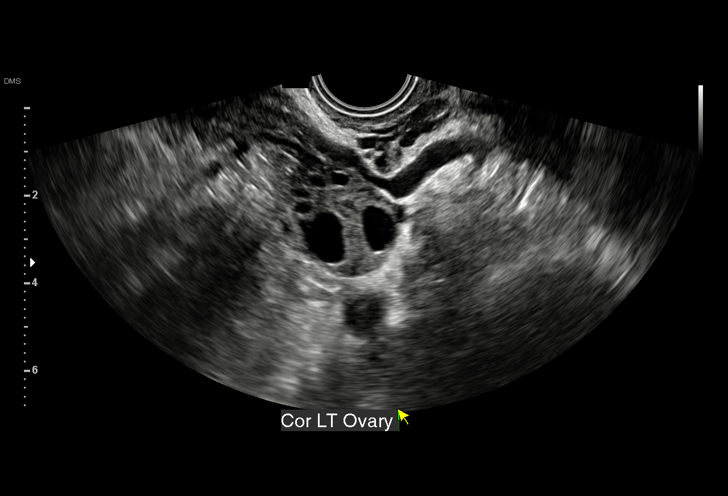
[im 53/53]
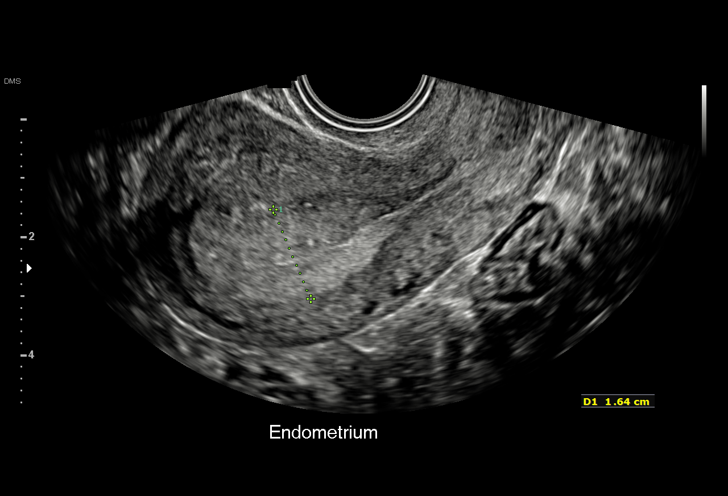

[15 of 28 positions shown; findings below may reference images not displayed]

FINDINGS: Intrauterine gestational sac: None

Maternal uterus/adnexae: Endometrial thickness measures 16 mm. Both
ovaries are normal appearance. No mass or abnormal free fluid
identified.
IMPRESSION: Pregnancy of unknown anatomic location (no intrauterine gestational
sac or adnexal mass identified). Differential diagnosis includes
recent spontaneous abortion, IUP too early to visualize, and
non-visualized ectopic pregnancy. Recommend correlation with serial
beta-hCG levels, and follow up US if warranted clinically.

## 2020-09-23 IMAGING — US US OB TRANSVAGINAL
1 series · 15 of 28 positions shown · non-contrast
Comparison: None.

CLINICAL DATA: Pelvic pain and vaginal bleeding. Gestational age by
LMP of 7 weeks 1 day.

EXAM:
TRANSVAGINAL OB ULTRASOUND
TECHNIQUE: Transvaginal ultrasound was performed for complete evaluation of the
gestation as well as the maternal uterus, adnexal regions, and
pelvic cul-de-sac.

[Series 1: us ob transvaginal · 15 of 39 slices shown]
[im 1/39]
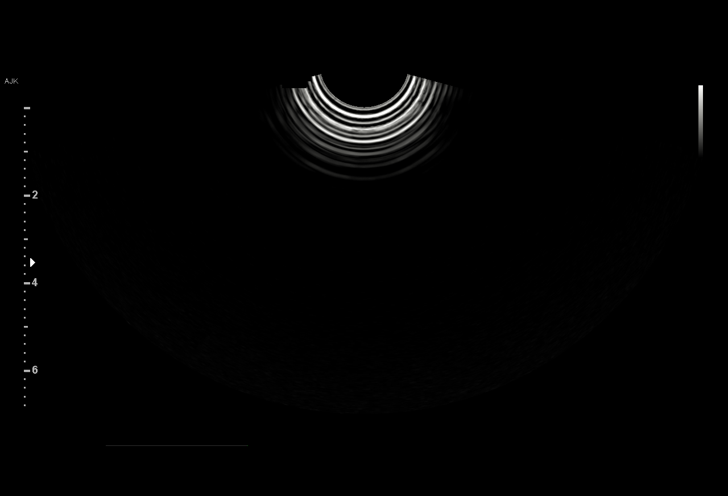
[im 3/39]
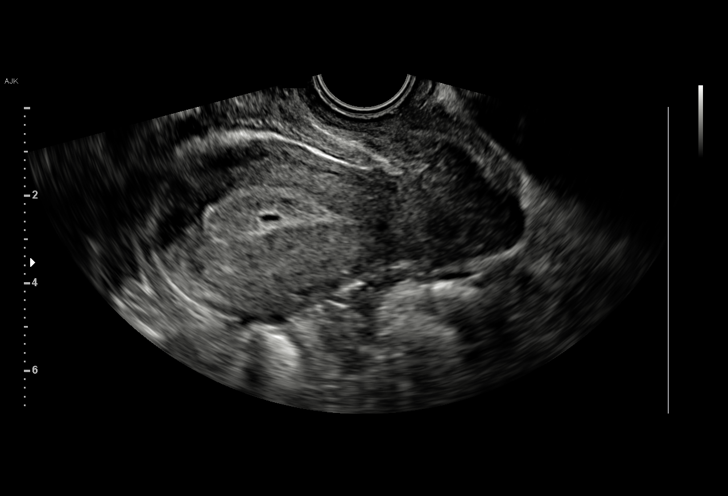
[im 6/39]
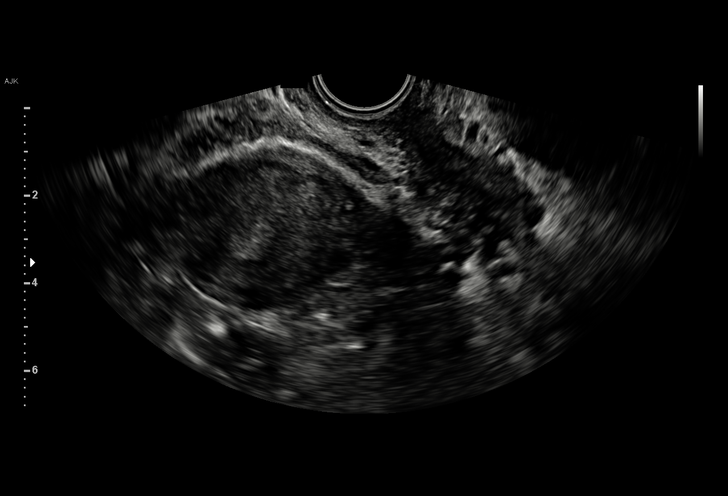
[im 9/39]
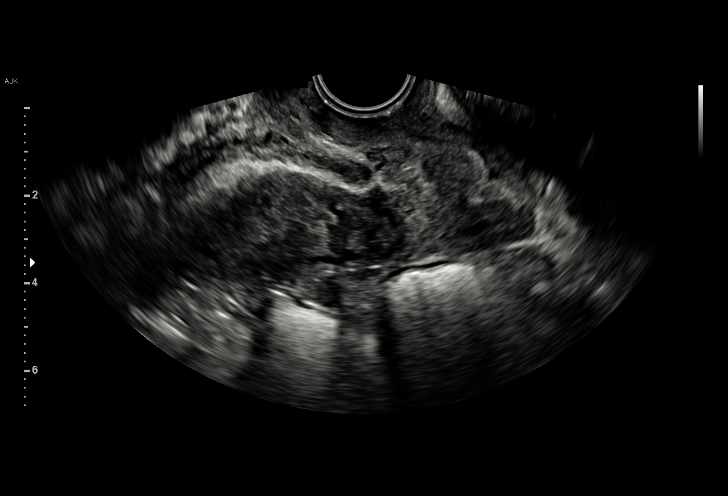
[im 12/39]
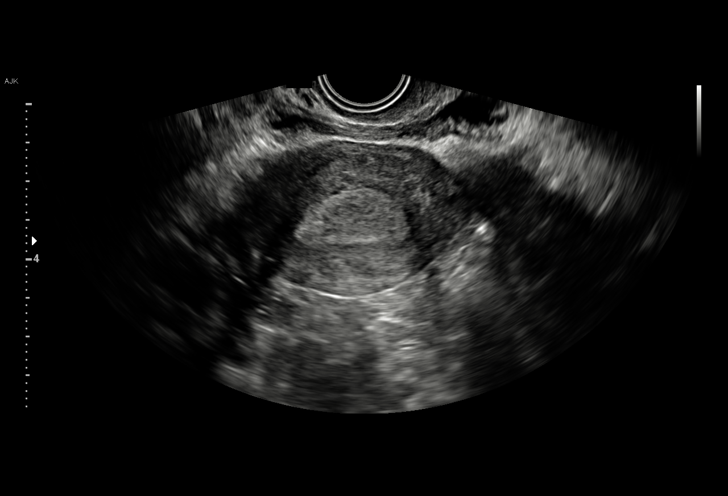
[im 15/39]
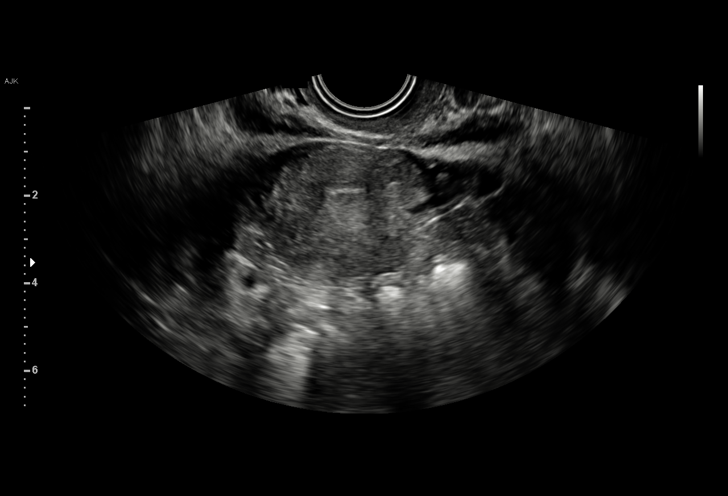
[im 17/39]
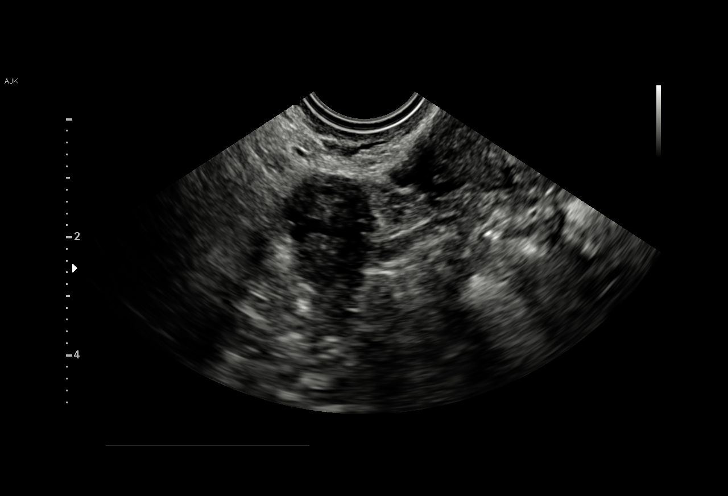
[im 20/39]
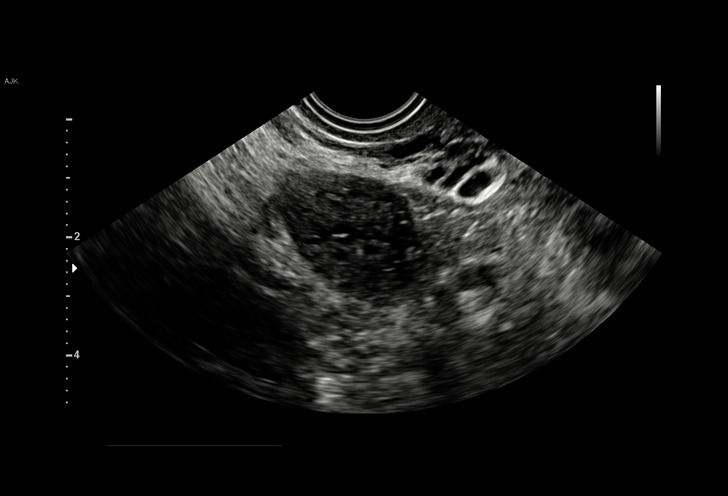
[im 22/39]
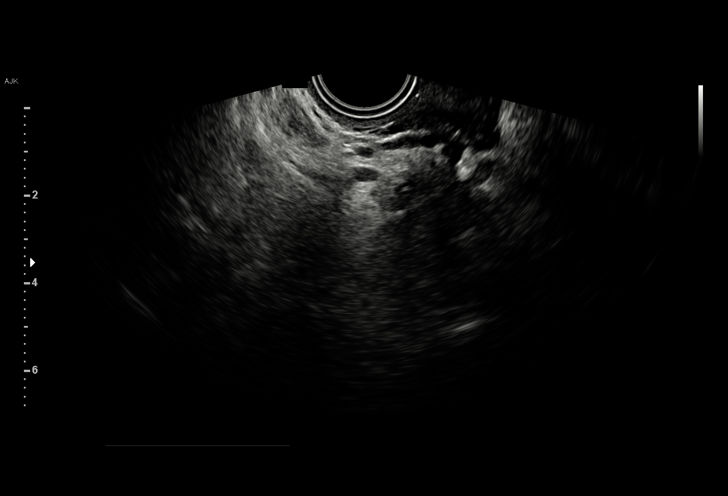
[im 24/39]
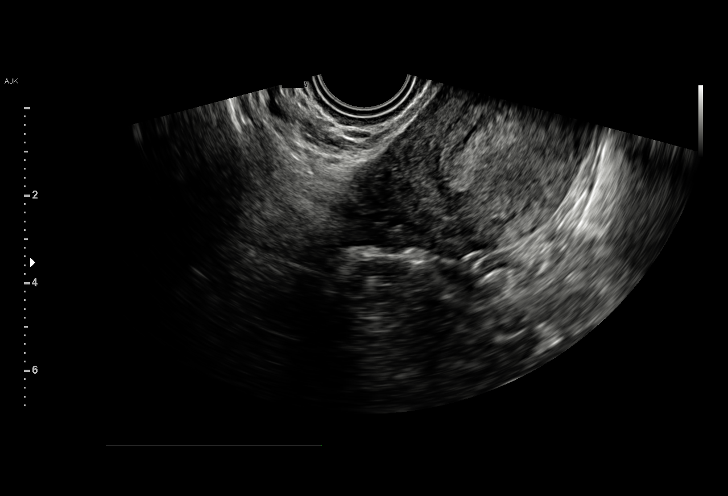
[im 27/39]
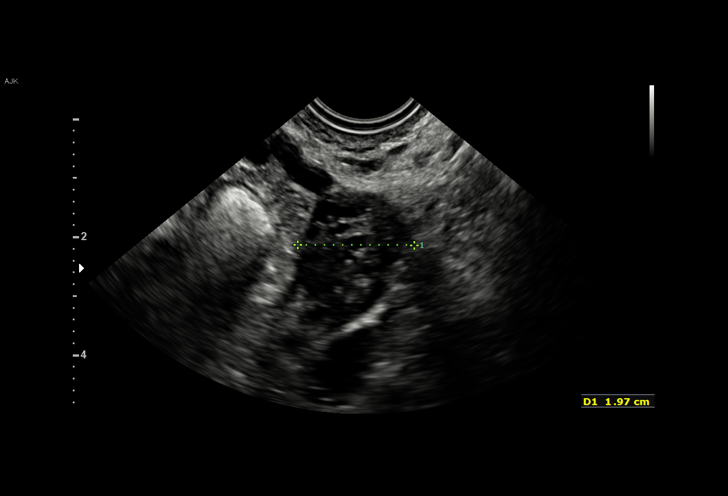
[im 30/39]
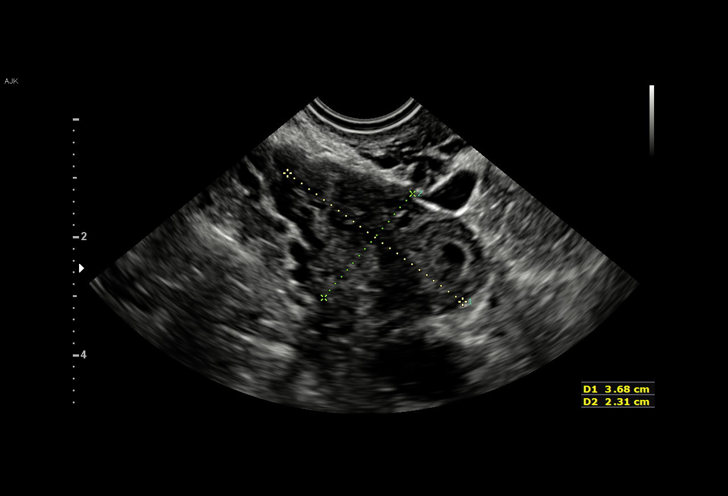
[im 33/39]
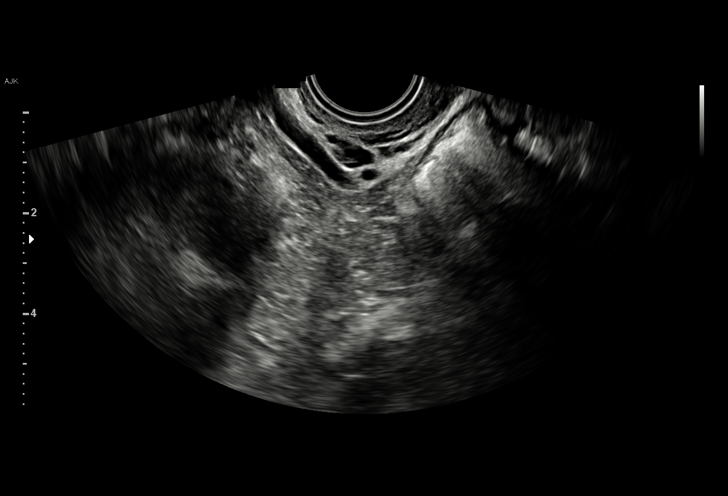
[im 36/39]
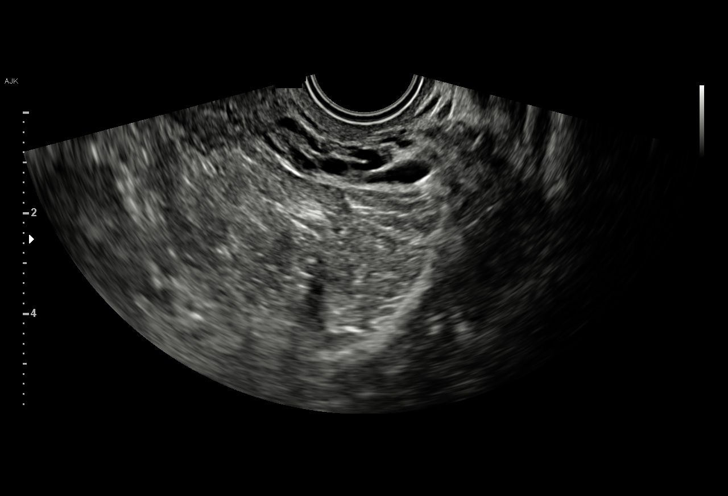
[im 39/39]
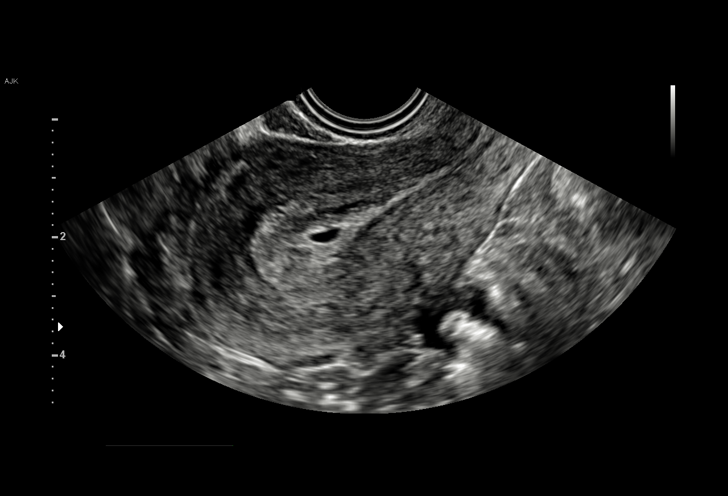

[15 of 28 positions shown; findings below may reference images not displayed]

FINDINGS: Intrauterine gestational sac: Single, with irregular shape

Yolk sac:  Not Visualized.

Embryo:  Not Visualized.

MSD: 6 mm   5 w   2 d

Subchorionic hemorrhage:  None visualized.

Maternal uterus/adnexae: Normal appearance of both ovaries. No mass
or abnormal free fluid identified.
IMPRESSION: Single approximately 5 week intrauterine gestational sac with
irregular shape, which is a poor prognostic sign. Suggest
correlation with serial b-hCG levels, and consider followup
ultrasound to assess viability in 10-14 days.

No maternal uterine or adnexal abnormality identified.

## 2022-03-27 ENCOUNTER — Ambulatory Visit
Admission: EM | Admit: 2022-03-27 | Discharge: 2022-03-27 | Disposition: A | Discharge status: Home / Self Care | Payer: Medicaid Other | Attending: Urgent Care | Admitting: Urgent Care

## 2022-03-27 DIAGNOSIS — J209 Acute bronchitis, unspecified: Secondary | ICD-10-CM | POA: Diagnosis not present

## 2022-03-27 DIAGNOSIS — R062 Wheezing: Secondary | ICD-10-CM

## 2022-03-27 MED ORDER — PREDNISONE 10 MG (21) PO TBPK
ORAL_TABLET | Freq: Every day | ORAL | 0 refills | Status: DC
Start: 2022-03-27 — End: 2023-05-16

## 2022-03-27 MED ORDER — METHYLPREDNISOLONE ACETATE 40 MG/ML IJ SUSP
40.0000 mg | Freq: Once | INTRAMUSCULAR | Status: AC
  Administered 2022-03-27: 40 mg via INTRAMUSCULAR

## 2022-03-27 NOTE — Discharge Instructions (Addendum)
Follow up here or with your primary care provider if your symptoms are worsening or not improving with treatment.     

## 2022-03-27 NOTE — ED Provider Notes (Signed)
[facility]    CSN: [id number] Arrival date & time: 03/27/22  1559      History   Chief Complaint Chief Complaint  Patient presents with   Cough   Wheezing    HPI Adrienne Craig is a 26 y.o. female.   HPI  Presents to UC with complaint of cough and wheezing x2 days.  She denies history of asthma but endorses family history of asthma.  She states she went to work today and felt a heaviness on her chest.  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 Authorizing Provider  cephALEXin (KEFLEX) 500 MG capsule Take 1 capsule (500 mg total) by mouth 3 (three) times daily. 09/28/18   Truett Mainland, DO  ondansetron (ZOFRAN) 4 MG tablet Take 1 tablet (4 mg total) by mouth every 8 (eight) hours as needed for nausea or vomiting. 07/11/20   Loni Beckwith, PA-C    Family History Family History  Problem Relation Age of Onset   Diabetes Mother    Arthritis Mother    Heart disease Mother    Stroke Mother    Asthma Mother    Healthy Father     Social History Social History   Tobacco Use   Smoking status: Never   Smokeless tobacco: Never  Vaping Use   Vaping Use: Never used  Substance Use Topics   Alcohol use: No   Drug use: No     Allergies   Patient has no known allergies.   Review of Systems Review of Systems   Physical Exam Triage Vital  Signs ED Triage Vitals  Enc Vitals Group     BP      Pulse      Resp      Temp      Temp src      SpO2      Weight      Height      Head Circumference      Peak Flow      Pain Score      Pain Loc      Pain Edu?      Excl. in [place]?    No data found.  Updated Vital Signs BP 110/74   Pulse 75   Temp 97.7 F (36.5 C)   Resp 16   LMP 03/04/2022 (Approximate)   SpO2 97%   Visual Acuity Right Eye Distance:   Left Eye Distance:   Bilateral Distance:    Right Eye Near:   Left Eye Near:  Bilateral Near:     Physical Exam Vitals reviewed.  Constitutional:      Appearance: Normal appearance.  Pulmonary:     Effort: Pulmonary effort is normal.     Breath sounds: Wheezing present.  Skin:    General: Skin is warm and dry.  Neurological:     General: No focal deficit present.     Mental Status: She is alert and oriented to person, place, and time.  Psychiatric:        Mood and Affect: Mood normal.        Behavior: Behavior normal.      UC Treatments / Results  Labs (all labs ordered are listed, but only abnormal results are displayed) Labs Reviewed - No data to display  EKG   Radiology No results found.  Procedures Procedures (including critical care time)  Medications Ordered in UC Medications - No data to display  Initial Impression / Assessment and Plan / UC Course  I have reviewed the triage vital signs and the nursing notes.  Pertinent labs & imaging results that were available during my care of the patient were reviewed by me and considered in my medical decision making (see chart for details).   Wheezing is present along with bronchial cough likely secondary to viral URI.  Will treat wheezing and bronchial restriction with corticosteroid, first dose administered in clinic.   Final Clinical Impressions(s) / UC Diagnoses   Final diagnoses:  None   Discharge Instructions   None    ED Prescriptions   None    PDMP not reviewed this  encounter.   Charma Igo, [place] 03/27/22 1645

## 2023-03-21 ENCOUNTER — Ambulatory Visit (HOSPITAL_COMMUNITY): Payer: Medicaid Other | Admitting: Clinical

## 2023-04-21 ENCOUNTER — Ambulatory Visit (HOSPITAL_COMMUNITY)
Admission: EM | Admit: 2023-04-21 | Discharge: 2023-04-21 | Disposition: A | Discharge status: Home / Self Care | Payer: Medicaid Other | Attending: Internal Medicine | Admitting: Internal Medicine

## 2023-04-21 ENCOUNTER — Encounter (HOSPITAL_COMMUNITY): Payer: Self-pay | Admitting: Emergency Medicine

## 2023-04-21 DIAGNOSIS — R051 Acute cough: Secondary | ICD-10-CM | POA: Diagnosis present

## 2023-04-21 DIAGNOSIS — R112 Nausea with vomiting, unspecified: Secondary | ICD-10-CM | POA: Diagnosis not present

## 2023-04-21 DIAGNOSIS — R197 Diarrhea, unspecified: Secondary | ICD-10-CM | POA: Insufficient documentation

## 2023-04-21 DIAGNOSIS — Z1152 Encounter for screening for COVID-19: Secondary | ICD-10-CM | POA: Diagnosis not present

## 2023-04-21 DIAGNOSIS — B349 Viral infection, unspecified: Secondary | ICD-10-CM | POA: Insufficient documentation

## 2023-04-21 LAB — SARS CORONAVIRUS 2 (TAT 6-24 HRS): SARS Coronavirus 2: NEGATIVE

## 2023-04-21 MED ORDER — BENZONATATE 100 MG PO CAPS: 100.0000 mg | ORAL_CAPSULE | Freq: Three times a day (TID) | ORAL | 0 refills | Status: DC | PRN

## 2023-04-21 MED ORDER — ONDANSETRON 4 MG PO TBDP
4.0000 mg | ORAL_TABLET | Freq: Three times a day (TID) | ORAL | 0 refills | Status: DC | PRN
Start: 2023-04-21 — End: 2023-05-16

## 2023-04-21 NOTE — ED Triage Notes (Signed)
Pt presents for chills, vomiting, dizziness, cough, and sore throat from throwing up.Symptoms started 3 days ago.    Pt is on abx for uti. States today should be last day for pills.

## 2023-04-21 NOTE — ED Provider Notes (Signed)
[facility]    CSN: [id number] Arrival date & time: 04/21/23  0801      History   Chief Complaint Chief Complaint  Patient presents with   Emesis   Fever    HPI Adrienne Craig is a 27 y.o. female.   Patient presents with chills, nausea, vomiting, diarrhea, cough, runny nose that started about 3 days ago.  Reports that she has been able to tolerate fluids intermittently but has not been able to tolerate food.  Denies blood in stool or emesis.  Reports that she has felt feverish but did not take temperature with thermometer.  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  She does mention that she is currently being treated for UTI with an antibiotic that she is not sure the name of.  Urinary symptoms have now resolved per patient.   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 Authorizing Provider  benzonatate (TESSALON) 100 MG capsule Take 1 capsule (100 mg total) by mouth every 8 (eight) hours as needed for cough. 04/21/23  Yes Loki Wuthrich, Rolly Salter E, FNP  nitrofurantoin, macrocrystal-monohydrate, (MACROBID) 100 MG capsule Take 100 mg by mouth every 12 (twelve) hours. 04/16/23  Yes [provider]  norgestimate-ethinyl estradiol (ORTHO-CYCLEN) 0.25-35 MG-MCG tablet Take 1  tablet by mouth daily. 03/29/23  Yes [provider]  ondansetron (ZOFRAN-ODT) 4 MG disintegrating tablet Take 1 tablet (4 mg total) by mouth every 8 (eight) hours as needed for nausea or vomiting. 04/21/23  Yes Bufford Helms, Rolly Salter E, FNP  PARoxetine (PAXIL) 10 MG tablet Take 10 mg by mouth every morning. 04/16/23  Yes [provider]  cephALEXin (KEFLEX) 500 MG capsule Take 1 capsule (500 mg total) by mouth 3 (three) times daily. 09/28/18   Levie Heritage, DO  predniSONE (STERAPRED UNI-PAK 21 TAB) 10 MG (21) TBPK tablet Take by mouth daily. Take 6 tabs by mouth daily for 1 day, then 5 tabs for 1 day, then 4 tabs for 1 day, then 3 tabs for 1 day, then 2 tabs for 1 day, then 1 tab by mouth daily for 1 day 03/27/22   Immordino, Jeannett Senior, FNP    Family History Family History  Problem Relation Age of Onset   Diabetes Mother    Arthritis Mother    Heart disease Mother    Stroke Mother    Asthma Mother    Healthy Father     Social History Social History  Tobacco Use   Smoking status: Never   Smokeless tobacco: Never  Vaping Use   Vaping status: Never Used  Substance Use Topics   Alcohol use: No   Drug use: No     Allergies   Patient has no known allergies.   Review of Systems Review of Systems Per HPI  Physical Exam Triage Vital Signs ED Triage Vitals  Encounter Vitals Group     BP 04/21/23 0813 122/85     Systolic BP Percentile --      Diastolic BP Percentile --      Pulse Rate 04/21/23 0813 90     Resp 04/21/23 0813 17     Temp 04/21/23 0813 98 F (36.7 C)     Temp Source 04/21/23 0813 Oral     SpO2 04/21/23 0813 96 %     Weight --      Height --      Head Circumference --      Peak Flow --      Pain Score 04/21/23 0818 7     Pain Loc --      Pain Education --      Exclude from Growth Chart --    No data found.  Updated Vital Signs BP 122/85 (BP Location: Right Arm)   Pulse 90   Temp 98 F (36.7 C) (Oral)   Resp 17   LMP 03/31/2023  (Approximate)   SpO2 96%   Breastfeeding Yes   Visual Acuity Right Eye Distance:   Left Eye Distance:   Bilateral Distance:    Right Eye Near:   Left Eye Near:    Bilateral Near:     Physical Exam Constitutional:      General: She is not in acute distress.    Appearance: Normal appearance. She is not toxic-appearing or diaphoretic.  HENT:     Head: Normocephalic and atraumatic.     Right Ear: Tympanic membrane and ear canal normal.     Left Ear: Tympanic membrane and ear canal normal.     Nose: Congestion present.     Mouth/Throat:     Mouth: Mucous membranes are moist.     Pharynx: No posterior oropharyngeal erythema.  Eyes:     Extraocular Movements: Extraocular movements intact.     Conjunctiva/sclera: Conjunctivae normal.     Pupils: Pupils are equal, round, and reactive to light.  Cardiovascular:     Rate and Rhythm: Normal rate and regular rhythm.     Pulses: Normal pulses.     Heart sounds: Normal heart sounds.  Pulmonary:     Effort: Pulmonary effort is normal. No respiratory distress.     Breath sounds: Normal breath sounds. No stridor. No wheezing, rhonchi or rales.  Abdominal:     General: Abdomen is flat. Bowel sounds are normal.     Palpations: Abdomen is soft.  Musculoskeletal:        General: Normal range of motion.     Cervical back: Normal range of motion.  Skin:    General: Skin is warm and dry.  Neurological:     General: No focal deficit present.     Mental Status: She is alert and oriented to person, place, and time. Mental status is at baseline.  Psychiatric:        Mood and Affect: Mood normal.        Behavior: Behavior normal.      UC Treatments / Results  Labs (all labs ordered are listed, but  only abnormal results are displayed) Labs Reviewed  SARS CORONAVIRUS 2 (TAT 6-24 HRS)    EKG   Radiology No results found.  Procedures Procedures (including critical care time)  Medications Ordered in UC Medications - No data to  display  Initial Impression / Assessment and Plan / UC Course  I have reviewed the triage vital signs and the nursing notes.  Pertinent labs & imaging results that were available during my care of the patient were reviewed by me and considered in my medical decision making (see chart for details).     Suspect patient has a viral illness.  There are no signs of acute abdomen or dehydration on exam.  No concern for strep throat.  Will test for COVID.  Advised patient to ensure adequate fluid hydration and bland diet.  Patient reports that she is breast-feeding but that she has stored milk that she can use if necessary while taking medication.  Therefore, will prescribe ondansetron and benzonatate for symptoms and patient encouraged to use stored breastmilk instead of breast-feeding at this time. It appears that patient does take paxil but I do think that ondansetron used in conjunction benefits outweighs risks to prevent dehydration. Advised strict follow-up if any symptoms persist or worsen.  Patient verbalized understanding and was agreeable with plan.   Final Clinical Impressions(s) / UC Diagnoses   Final diagnoses:  Viral illness  Acute cough  Nausea vomiting and diarrhea     Discharge Instructions      Suspect viral cause to your symptoms as we discussed.  I have prescribed you a nausea medication and a cough medication.  Recommend avoiding breast-feeding with these medications.  ensure adequate fluids, rest, bland diet as we discussed.  COVID test is pending.  Follow-up if any symptoms persist or worsen.    ED Prescriptions     Medication Sig Dispense Auth. Provider   ondansetron (ZOFRAN-ODT) 4 MG disintegrating tablet Take 1 tablet (4 mg total) by mouth every 8 (eight) hours as needed for nausea or vomiting. 20 tablet [place], [place] E, [place]   benzonatate (TESSALON) 100 MG capsule Take 1 capsule (100 mg total) by mouth every 8 (eight) hours as needed for cough. 21 capsule [place], Acie Fredrickson, [place]      PDMP not reviewed this encounter.   Gustavus Bryant, [place] 04/21/23 [phone]

## 2023-04-21 NOTE — Discharge Instructions (Signed)
Suspect viral cause to your symptoms as we discussed.  I have prescribed you a nausea medication and a cough medication.  Recommend avoiding breast-feeding with these medications.  ensure adequate fluids, rest, bland diet as we discussed.  COVID test is pending.  Follow-up if any symptoms persist or worsen.

## 2023-05-16 ENCOUNTER — Telehealth (HOSPITAL_COMMUNITY): Payer: Self-pay | Admitting: Family Medicine

## 2023-05-16 ENCOUNTER — Ambulatory Visit (INDEPENDENT_AMBULATORY_CARE_PROVIDER_SITE_OTHER): Payer: Medicaid Other

## 2023-05-16 ENCOUNTER — Ambulatory Visit (HOSPITAL_COMMUNITY)
Admission: EM | Admit: 2023-05-16 | Discharge: 2023-05-16 | Disposition: A | Discharge status: Home / Self Care | Payer: Medicaid Other | Attending: Family Medicine | Admitting: Family Medicine

## 2023-05-16 ENCOUNTER — Encounter (HOSPITAL_COMMUNITY): Payer: Self-pay | Admitting: Emergency Medicine

## 2023-05-16 DIAGNOSIS — J209 Acute bronchitis, unspecified: Secondary | ICD-10-CM | POA: Insufficient documentation

## 2023-05-16 DIAGNOSIS — R062 Wheezing: Secondary | ICD-10-CM | POA: Insufficient documentation

## 2023-05-16 DIAGNOSIS — J0101 Acute recurrent maxillary sinusitis: Secondary | ICD-10-CM | POA: Insufficient documentation

## 2023-05-16 DIAGNOSIS — R051 Acute cough: Secondary | ICD-10-CM

## 2023-05-16 DIAGNOSIS — H6121 Impacted cerumen, right ear: Secondary | ICD-10-CM | POA: Insufficient documentation

## 2023-05-16 LAB — CBC WITH DIFFERENTIAL/PLATELET
Abs Immature Granulocytes: 0.04 10*3/uL (ref 0.00–0.07)
Basophils Absolute: 0 10*3/uL (ref 0.0–0.1)
Basophils Relative: 0 %
Eosinophils Absolute: 0.3 10*3/uL (ref 0.0–0.5)
Eosinophils Relative: 3 %
HCT: 42.3 % (ref 36.0–46.0)
Hemoglobin: 14.3 g/dL (ref 12.0–15.0)
Immature Granulocytes: 0 %
Lymphocytes Relative: 25 %
Lymphs Abs: 2.7 10*3/uL (ref 0.7–4.0)
MCH: 31 pg (ref 26.0–34.0)
MCHC: 33.8 g/dL (ref 30.0–36.0)
MCV: 91.8 fL (ref 80.0–100.0)
Monocytes Absolute: 0.5 10*3/uL (ref 0.1–1.0)
Monocytes Relative: 4 %
Neutro Abs: 7.2 10*3/uL (ref 1.7–7.7)
Neutrophils Relative %: 68 %
Platelets: 255 10*3/uL (ref 150–400)
RBC: 4.61 MIL/uL (ref 3.87–5.11)
RDW: 12.5 % (ref 11.5–15.5)
WBC: 10.7 10*3/uL — ABNORMAL HIGH (ref 4.0–10.5)
nRBC: 0 % (ref 0.0–0.2)

## 2023-05-16 MED ORDER — ALBUTEROL SULFATE HFA 108 (90 BASE) MCG/ACT IN AERS
2.0000 | INHALATION_SPRAY | Freq: Once | RESPIRATORY_TRACT | Status: AC
  Administered 2023-05-16: 2 via RESPIRATORY_TRACT

## 2023-05-16 MED ORDER — AMOXICILLIN-POT CLAVULANATE 875-125 MG PO TABS: 1.0000 | ORAL_TABLET | Freq: Two times a day (BID) | ORAL | 0 refills | Status: AC

## 2023-05-16 MED ORDER — AEROCHAMBER PLUS FLO-VU MEDIUM MISC
1.0000 | Freq: Once | Status: AC
  Administered 2023-05-16: 1

## 2023-05-16 MED ORDER — AEROCHAMBER PLUS FLO-VU LARGE MISC
Status: AC
  Filled 2023-05-16: qty 1

## 2023-05-16 MED ORDER — ALBUTEROL SULFATE HFA 108 (90 BASE) MCG/ACT IN AERS
INHALATION_SPRAY | RESPIRATORY_TRACT | Status: AC
  Filled 2023-05-16: qty 6.7

## 2023-05-16 NOTE — Discharge Instructions (Addendum)
Use Inhaler, 2 puffs, every four hours, as needed, for persistent cough or wheezing.   Use OTC Mucinex and the Augmentin. Return if not improving or if condition worsens.

## 2023-05-16 NOTE — Telephone Encounter (Signed)
Telephone note opened in error

## 2023-05-16 NOTE — ED Provider Notes (Signed)
[facility]    CSN: [id number] Arrival date & time: 05/16/23  1120      History   Chief Complaint Chief Complaint  Patient presents with   Cough   Emesis   Sore Throat    HPI Adrienne Craig is a 27 y.o. female.   Here with complaint of cough chest congestion green nasal discharge and maxillary sinus pain.  Patient is concerned her daughter is acutely ill with RSV and her son has been diagnosed with walking pneumonia.  She worries that she may have either RSV or Pneumonia herself.  She was treated at urgent care on 04/21/2023 for acute viral illness with nausea vomiting and respiratory symptoms.  She was treated with benzonatate and ondansetron, but has not used them unless she is miserable, because she has to discard her breat milk if she uses the Ondansetron.  She feels like overall she got better but she worries that her sinusitis is secondary to that respiratory infection that never totally resolved.  She vomited this morning when she was starting to brush her teeth.  She does not think she gagged herself and it was just a one-time event.  She has eaten and had some fluids during the day today.  She has not had any further nausea nor vomiting.  She is a little tight or short of breath if she starts coughing a lot.   Cough Associated symptoms: sore throat and wheezing (rare - after coughing a lot)   Associated symptoms: no chest pain and no fever   Emesis Associated symptoms: arthralgias (reporting some intermittent muscle cramps arms and legs), cough and sore throat   Associated symptoms: no abdominal pain, no diarrhea and no fever   Sore Throat Pertinent negatives include no chest pain and no abdominal pain.    Past Medical History:  Diagnosis Date   Depression    mild PP, doing ok now   Kidney stones     Patient Active Problem List   Diagnosis Date Noted   Bacterial vaginitis 04/17/2018   Pregnancy of unknown anatomic location 04/17/2018   HSV-1  infection 11/04/2017    Past Surgical History:  Procedure Laterality Date   [place] Butt Lift     NO PAST SURGERIES      OB History     Gravida  2   Para  1   Term  1   Preterm  0   AB  0   Living  1      SAB  0   IAB  0   Ectopic  0   Multiple  0   Live Births  1            Home Medications    Prior to Admission medications   Medication Sig Start Date End Date Taking? Authorizing Provider  amoxicillin-clavulanate (AUGMENTIN) 875-125 MG tablet Take 1 tablet by mouth 2 (two) times daily for 10 days. 05/16/23 05/26/23 Yes Prescilla Sours, FNP  norgestimate-ethinyl estradiol (ORTHO-CYCLEN) 0.25-35 MG-MCG tablet Take 1 tablet by mouth daily. 03/29/23   [provider]  PARoxetine (PAXIL) 10 MG tablet Take 10 mg by mouth every morning. 04/16/23   [provider]    Family History Family History  Problem Relation Age of Onset   Diabetes Mother    Arthritis Mother    Heart disease Mother    Stroke Mother    Asthma Mother    Healthy Father     Social History Social History  Tobacco Use   Smoking status: Never   Smokeless tobacco: Never  Vaping Use   Vaping status: Never Used  Substance Use Topics   Alcohol use: No   Drug use: No     Allergies   Patient has no known allergies.   Review of Systems Review of Systems  Constitutional:  Positive for fatigue. Negative for fever.  HENT:  Positive for congestion, hearing loss (on the right), sinus pressure (maxillary), sinus pain (maxillary) and sore throat.   Eyes: Negative.   Respiratory:  Positive for cough and wheezing (rare - after coughing a lot).   Cardiovascular:  Negative for chest pain and palpitations.  Gastrointestinal:  Positive for nausea and vomiting. Negative for abdominal pain, constipation and diarrhea.  Endocrine: Negative.   Genitourinary:  Negative for dysuria.  Musculoskeletal:  Positive for arthralgias (reporting some intermittent muscle cramps arms and  legs).  Neurological: Negative.   Hematological:  Does not bruise/bleed easily.     Physical Exam Triage Vital Signs ED Triage Vitals  Encounter Vitals Group     BP 05/16/23 1357 100/71     Systolic BP Percentile --      Diastolic BP Percentile --      Pulse Rate 05/16/23 1357 87     Resp 05/16/23 1357 17     Temp 05/16/23 1357 97.8 F (36.6 C)     Temp Source 05/16/23 1357 Oral     SpO2 05/16/23 1357 96 %     Weight --      Height --      Head Circumference --      Peak Flow --      Pain Score 05/16/23 1356 5     Pain Loc --      Pain Education --      Exclude from Growth Chart --    No data found.  Updated Vital Signs BP 100/71   Pulse 87   Temp 97.8 F (36.6 C) (Oral)   Resp 17   LMP 05/05/2023 (Approximate)   SpO2 96%   Breastfeeding Yes   Visual Acuity Right Eye Distance:   Left Eye Distance:   Bilateral Distance:    Right Eye Near:   Left Eye Near:    Bilateral Near:     Physical Exam Constitutional:      Appearance: Normal appearance.  HENT:     Head: Normocephalic.     Right Ear: External ear normal. There is impacted cerumen (large amount of brown cerumen and decreased hearing in this ear).     Left Ear: Hearing, tympanic membrane, ear canal and external ear normal.     Nose: Congestion (with some yellow mucus in the nasal passages) present.     Right Turbinates: Swollen.     Left Turbinates: Swollen.     Right Sinus: Maxillary sinus tenderness present.     Left Sinus: Maxillary sinus tenderness present.     Mouth/Throat:     Mouth: Mucous membranes are moist.     Pharynx: Uvula midline. No oropharyngeal exudate or posterior oropharyngeal erythema.     Tonsils: No tonsillar exudate or tonsillar abscesses.  Eyes:     General: Lids are normal.     Conjunctiva/sclera: Conjunctivae normal.     Pupils: Pupils are equal, round, and reactive to light.  Cardiovascular:     Rate and Rhythm: Normal rate and regular rhythm.     Heart sounds: Normal  heart sounds.  Pulmonary:     Effort:  Pulmonary effort is normal.     Breath sounds: Decreased air movement (breath sounds are quiet or muffled, even with good deep breaths.) present. Wheezing (bilateral upper lobes - intermittent & mild) present.  Abdominal:     General: Abdomen is flat. Bowel sounds are normal.     Palpations: Abdomen is soft.     Tenderness: There is no abdominal tenderness.  Musculoskeletal:     Cervical back: Normal range of motion and neck supple.  Lymphadenopathy:     Head:     Right side of head: Submandibular adenopathy present. No posterior auricular adenopathy.     Left side of head: Submandibular adenopathy present. No posterior auricular adenopathy.     Cervical: No cervical adenopathy.     Right cervical: No superficial cervical adenopathy.    Left cervical: No superficial cervical adenopathy.  Skin:    General: Skin is warm and dry.     Findings: No rash.  Neurological:     Mental Status: She is alert and oriented to person, place, and time.     Gait: Gait normal.  Psychiatric:        Mood and Affect: Mood normal.        Behavior: Behavior normal.      UC Treatments / Results  Labs (all labs ordered are listed, but only abnormal results are displayed) Labs Reviewed  CBC WITH DIFFERENTIAL/PLATELET    EKG   Radiology No results found.  Procedures Procedures (including critical care time)  Medications Ordered in UC Medications  albuterol (VENTOLIN HFA) 108 (90 Base) MCG/ACT inhaler 2 puff (2 puffs Inhalation Given 05/16/23 1457)  AeroChamber Plus Flo-Vu Medium MISC 1 each (1 each Other Given 05/16/23 1457)    Initial Impression / Assessment and Plan / UC Course  I have reviewed the triage vital signs and the nursing notes.  Pertinent labs & imaging results that were available during my care of the patient were reviewed by me and considered in my medical decision making (see chart for details).  Acute maxillary sinusitis: Take  Augmentin 875/125 mg 1 twice a day for 10 days.  This is a pregnancy category B medication and safe for women lactating patient was updated on this.  May use over-the-counter plain Mucinex or guaifenesin as directed on the package for congestion.  Again Mucinex is pregnancy category C.  She may use it and continue to breast feed but instructed to only use it, if needed.   Encouraged excellent her high-level fluid intake 2 to 3 L or more daily especially given her lactation.  Follow-up if symptoms do not improve and resolve or if new symptoms occur.  Acute viral bronchitis, Cough & Wheezing: Educated on bronchitis given a spacer and an albuterol inhaler. and not coughing.  She was dispensed the inhaler and the spacer to take and use at home to use 2 puffs of the albuterol every 4 hours as needed for excessive coughing or for wheezing.  Right cerumen impaction with mild hearing loss: Advised to return when she is feeling better for ear lavage.  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 Final Clinical Impressions(s) / UC Diagnoses   Final diagnoses:  Acute recurrent maxillary sinusitis  Acute cough  Wheezing  Acute bronchitis, unspecified organism  Hearing loss of right ear due to cerumen impaction     Discharge Instructions      Use Inhaler, 2 puffs, every four hours, as needed, for persistent cough or wheezing.  Use OTC Mucinex and the Augmentin. Return if not improving or if condition worsens.     ED Prescriptions     Medication Sig Dispense Auth. Provider   amoxicillin-clavulanate (AUGMENTIN) 875-125 MG tablet Take 1 tablet by mouth 2 (two) times daily for 10 days. 20 tablet Prescilla Sours, FNP      PDMP not reviewed this encounter.   Prescilla Sours, FNP 05/16/23 1527

## 2023-05-16 NOTE — ED Triage Notes (Signed)
Pt presents with cough, sore throat, throwing up for 4days  States her kids have RSV and walking pnuemonia.

## 2023-07-03 ENCOUNTER — Ambulatory Visit (HOSPITAL_COMMUNITY): Payer: Medicaid Other

## 2023-07-03 ENCOUNTER — Encounter (HOSPITAL_COMMUNITY): Payer: Self-pay

## 2023-07-03 ENCOUNTER — Ambulatory Visit (HOSPITAL_COMMUNITY)
Admission: EM | Admit: 2023-07-03 | Discharge: 2023-07-03 | Disposition: A | Discharge status: Home / Self Care | Payer: Medicaid Other | Attending: Family Medicine | Admitting: Family Medicine

## 2023-07-03 DIAGNOSIS — J069 Acute upper respiratory infection, unspecified: Secondary | ICD-10-CM | POA: Diagnosis not present

## 2023-07-03 DIAGNOSIS — T148XXA Other injury of unspecified body region, initial encounter: Secondary | ICD-10-CM | POA: Diagnosis not present

## 2023-07-03 DIAGNOSIS — M542 Cervicalgia: Secondary | ICD-10-CM | POA: Diagnosis not present

## 2023-07-03 MED ORDER — KETOROLAC TROMETHAMINE 30 MG/ML IJ SOLN
30.0000 mg | Freq: Once | INTRAMUSCULAR | Status: AC
  Administered 2023-07-03: 30 mg via INTRAMUSCULAR

## 2023-07-03 MED ORDER — METHYLPREDNISOLONE 4 MG PO TBPK: ORAL_TABLET | ORAL | 0 refills | Status: DC

## 2023-07-03 MED ORDER — TIZANIDINE HCL 4 MG PO TABS: 4.0000 mg | ORAL_TABLET | Freq: Three times a day (TID) | ORAL | 0 refills | Status: DC | PRN

## 2023-07-03 MED ORDER — KETOROLAC TROMETHAMINE 30 MG/ML IJ SOLN
INTRAMUSCULAR | Status: AC
  Filled 2023-07-03: qty 1

## 2023-07-03 NOTE — ED Provider Notes (Signed)
 [facility]    CSN: [id number] Arrival date & time: 07/03/23  0803      History   Chief Complaint Chief Complaint  Patient presents with   Cough    HPI Adrienne Craig is a 28 y.o. female.    Cough Associated symptoms: eye discharge and rhinorrhea   Associated symptoms: no chills and no fever    Patient is here for URI symptoms x 2 days.  Having a runny nose, tickle in her throat that causes a cough, and watery eyes.  The cough can cause fits as well.  She has a burning pain down her arms to her fingertips and to her neck when she coughs.  The other night she had a shooting pain into her head with coughing, and it woke her up.  She does have h/o allergies.  Taking otc medications with slight relief.  No fevers/chills.  No wheezing or sob.  NO dizziness or light headedness.        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 Authorizing Provider  hydrOXYzine (ATARAX) 25 MG tablet Take 25 mg by mouth every 6 (six) hours as needed. 02/25/23   [provider]  metoCLOPramide (REGLAN) 10 MG tablet Take by mouth. 07/02/23  Yes [provider]  norgestimate-ethinyl estradiol (ORTHO-CYCLEN) 0.25-35 MG-MCG tablet Take 1 tablet by mouth daily. 03/29/23  Yes [provider]    Family History Family History   Problem Relation Age of Onset   Diabetes Mother    Arthritis Mother    Heart disease Mother    Stroke Mother    Asthma Mother    Healthy Father     Social History Social History   Tobacco Use   Smoking status: Never   Smokeless tobacco: Never  Vaping Use   Vaping status: Never Used  Substance Use Topics   Alcohol use: No   Drug use: No     Allergies   Latex   Review of Systems Review of Systems  Constitutional:  Negative for chills and fever.  HENT:  Positive for congestion and rhinorrhea.   Eyes:  Positive for discharge.  Respiratory:  Positive for cough.   Gastrointestinal: Negative.   Genitourinary: Negative.   Musculoskeletal:  Positive for neck pain.  Neurological:  Negative for dizziness, weakness and light-headedness.  Hematological: Negative.   Psychiatric/Behavioral: Negative.       Physical Exam Triage Vital Signs ED Triage Vitals  Encounter Vitals Group     BP 07/03/23 0832 130/89  Systolic BP Percentile --      Diastolic BP Percentile --      Pulse Rate 07/03/23 0832 78     Resp 07/03/23 0832 16     Temp 07/03/23 0832 98.1 F (36.7 C)     Temp Source 07/03/23 0832 Oral     SpO2 07/03/23 0832 97 %     Weight 07/03/23 0832 173 lb 11.6 oz (78.8 kg)     Height 07/03/23 0832 5' 1.5 (1.562 m)     Head Circumference --      Peak Flow --      Pain Score 07/03/23 0829 8     Pain Loc --      Pain Education --      Exclude from Growth Chart --    No data found.  Updated Vital Signs BP 130/89 (BP Location: Right Arm)   Pulse 78   Temp 98.1 F (36.7 C) (Oral)   Resp 16   Ht 5' 1.5 (1.562 m)   Wt 78.8 kg   LMP 07/02/2023 (Approximate)   SpO2 97%   Breastfeeding Yes   BMI 32.29 kg/m   Visual Acuity Right Eye Distance:   Left Eye Distance:   Bilateral Distance:    Right Eye Near:   Left Eye Near:    Bilateral Near:     Physical Exam Constitutional:      General: She is not in acute distress.    Appearance: Normal  appearance. She is normal weight. She is not ill-appearing or toxic-appearing.  HENT:     Nose: Congestion present. No rhinorrhea.     Mouth/Throat:     Mouth: Mucous membranes are moist.     Pharynx: No posterior oropharyngeal erythema.  Cardiovascular:     Rate and Rhythm: Normal rate and regular rhythm.  Pulmonary:     Effort: Pulmonary effort is normal.     Breath sounds: Rhonchi present. No wheezing or rales.  Musculoskeletal:     Cervical back: Normal range of motion and neck supple.     Comments: No spinous tenderness today.  She has TTP to the right neck/trapezius;  Full rom of the neck with pain with rotation  Lymphadenopathy:     Cervical: No cervical adenopathy.  Skin:    General: Skin is warm.  Neurological:     General: No focal deficit present.     Mental Status: She is alert and oriented to person, place, and time.     Cranial Nerves: No cranial nerve deficit.     Motor: No weakness.  Psychiatric:        Mood and Affect: Mood normal.      UC Treatments / Results  Labs (all labs ordered are listed, but only abnormal results are displayed) Labs Reviewed - No data to display  EKG   Radiology No results found.  Procedures Procedures (including critical care time)  Medications Ordered in UC Medications  ketorolac  (TORADOL ) 30 MG/ML injection 30 mg (has no administration in time range)    Initial Impression / Assessment and Plan / UC Course  I have reviewed the triage vital signs and the nursing notes.  Pertinent labs & imaging results that were available during my care of the patient were reviewed by me and considered in my medical decision making (see chart for details).   Final Clinical Impressions(s) / UC Diagnoses   Final diagnoses:  Upper respiratory tract infection, unspecified type  Neck pain  Muscle strain  Discharge Instructions      You were seen today for allergy symptoms, along with back/neck pain.  I think you have strained  your back with all the coughing you have been doing.  I have given you a shot today, which should take effect in the next 30 minutes or so.  I have sent out a steroid to help with the allergy symptoms to hopefully help with that cough.  I am hopeful that will also help with the back/neck pain.  I have also sent out a muscle relaxer.  This may make you tired/sleepy so take when home and not driving.  You may pump/dump while taking this medication, and resume breast feeding 24hrs after your last dose.  I recommend you use tylenol /motrin  for pain, and use a heating pad/ice pack.  If you continue with back/neck pain, or it worsens, then please go to the ER for further evaluation.     ED Prescriptions     Medication Sig Dispense Auth. Provider   methylPREDNISolone  (MEDROL  DOSEPAK) 4 MG TBPK tablet Take as directed 1 each Jeane Cashatt, MD   tiZANidine  (ZANAFLEX ) 4 MG tablet Take 1 tablet (4 mg total) by mouth every 8 (eight) hours as needed for muscle spasms. 30 tablet Darral Longs, MD      PDMP not reviewed this encounter.   Darral Longs, MD 07/03/23 [phone]

## 2023-07-03 NOTE — Discharge Instructions (Addendum)
 You were seen today for allergy symptoms, along with back/neck pain.  I think you have strained your back with all the coughing you have been doing.  I have given you a shot today, which should take effect in the next 30 minutes or so.  I have sent out a steroid to help with the allergy symptoms to hopefully help with that cough.  I am hopeful that will also help with the back/neck pain.  I have also sent out a muscle relaxer.  This may make you tired/sleepy so take when home and not driving.  You may pump/dump while taking this medication, and resume breast feeding 24hrs after your last dose.  I recommend you use tylenol /motrin  for pain, and use a heating pad/ice pack.  If you continue with back/neck pain, or it worsens, then please go to the ER for further evaluation.

## 2023-07-03 NOTE — ED Triage Notes (Signed)
 cough, headache, watery eyes, burning pain down the neck and arms x 2 days. Patient states she has a history of allergies that will flare up like this. Has shooting pain in the neck and arms with throbbing and burning.   Tried hot/cold showers, and tylenol  with slight relief.

## 2023-11-13 ENCOUNTER — Ambulatory Visit (INDEPENDENT_AMBULATORY_CARE_PROVIDER_SITE_OTHER)

## 2023-11-13 ENCOUNTER — Encounter (HOSPITAL_COMMUNITY): Payer: Self-pay

## 2023-11-13 ENCOUNTER — Ambulatory Visit (HOSPITAL_COMMUNITY)
Admission: EM | Admit: 2023-11-13 | Discharge: 2023-11-13 | Disposition: A | Discharge status: Home / Self Care | Attending: Family Medicine | Admitting: Family Medicine

## 2023-11-13 DIAGNOSIS — S161XXA Strain of muscle, fascia and tendon at neck level, initial encounter: Secondary | ICD-10-CM

## 2023-11-13 DIAGNOSIS — Z3202 Encounter for pregnancy test, result negative: Secondary | ICD-10-CM | POA: Diagnosis not present

## 2023-11-13 DIAGNOSIS — S39012A Strain of muscle, fascia and tendon of lower back, initial encounter: Secondary | ICD-10-CM

## 2023-11-13 LAB — POCT URINE PREGNANCY: Preg Test, Ur: NEGATIVE

## 2023-11-13 MED ORDER — KETOROLAC TROMETHAMINE 60 MG/2ML IM SOLN
INTRAMUSCULAR | Status: AC
  Filled 2023-11-13: qty 2

## 2023-11-13 MED ORDER — TIZANIDINE HCL 4 MG PO TABS: 4.0000 mg | ORAL_TABLET | Freq: Every day | ORAL | 0 refills | Status: AC

## 2023-11-13 MED ORDER — KETOROLAC TROMETHAMINE 60 MG/2ML IM SOLN
60.0000 mg | Freq: Once | INTRAMUSCULAR | Status: AC
Start: 2023-11-13 — End: 2023-11-13
  Administered 2023-11-13: 60 mg via INTRAMUSCULAR
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
## 2023-11-13 NOTE — Discharge Instructions (Addendum)
 You can take muscle relaxers for sleep if needed.  Make sure that you do not drive a vehicle afterwards. Apply lidocaine  patches to the areas of soreness for 12 hours and then remove for 12 hours You can also apply heat or ice over the areas depending on which one gives you more relief. Once her pain subsides start gentle stretching and avoid holding her head out in front of you for prolonged periods of time looking at digital devices If you develop any numbness, tingling, headaches, vision changes, balance issues, nausea vomiting return or follow-up with orthopedic office. Make sure not to take the muscle relaxers if you are breast-feeding.
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
